# Patient Record
Sex: Female | Born: 1980
Health system: Southern US, Community
[De-identification: ages and names within clinical notes are randomized; demographics above are authoritative.]

## PROBLEM LIST (undated history)

## (undated) ENCOUNTER — Inpatient Hospital Stay (HOSPITAL_COMMUNITY): Payer: Self-pay

## (undated) DIAGNOSIS — B999 Unspecified infectious disease: Secondary | ICD-10-CM

## (undated) DIAGNOSIS — D649 Anemia, unspecified: Secondary | ICD-10-CM

## (undated) DIAGNOSIS — IMO0002 Reserved for concepts with insufficient information to code with codable children: Secondary | ICD-10-CM

## (undated) DIAGNOSIS — N83209 Unspecified ovarian cyst, unspecified side: Secondary | ICD-10-CM

## (undated) DIAGNOSIS — R87629 Unspecified abnormal cytological findings in specimens from vagina: Secondary | ICD-10-CM

## (undated) DIAGNOSIS — R87619 Unspecified abnormal cytological findings in specimens from cervix uteri: Secondary | ICD-10-CM

## (undated) HISTORY — DX: Unspecified abnormal cytological findings in specimens from cervix uteri: R87.619

## (undated) HISTORY — DX: Reserved for concepts with insufficient information to code with codable children: IMO0002

## (undated) HISTORY — PX: NO PAST SURGERIES: SHX2092

## (undated) HISTORY — DX: Unspecified abnormal cytological findings in specimens from vagina: R87.629

---

## 2013-02-14 ENCOUNTER — Encounter (HOSPITAL_COMMUNITY): Payer: Self-pay | Admitting: *Deleted

## 2013-02-14 ENCOUNTER — Inpatient Hospital Stay (HOSPITAL_COMMUNITY)
Admission: AD | Admit: 2013-02-14 | Discharge: 2013-02-14 | Disposition: A | Discharge status: Home / Self Care | Payer: Self-pay | Source: Ambulatory Visit | Attending: Obstetrics & Gynecology | Admitting: Obstetrics & Gynecology

## 2013-02-14 DIAGNOSIS — O2390 Unspecified genitourinary tract infection in pregnancy, unspecified trimester: Secondary | ICD-10-CM | POA: Insufficient documentation

## 2013-02-14 DIAGNOSIS — B3731 Acute candidiasis of vulva and vagina: Secondary | ICD-10-CM | POA: Insufficient documentation

## 2013-02-14 DIAGNOSIS — M25519 Pain in unspecified shoulder: Secondary | ICD-10-CM | POA: Insufficient documentation

## 2013-02-14 DIAGNOSIS — N949 Unspecified condition associated with female genital organs and menstrual cycle: Secondary | ICD-10-CM | POA: Insufficient documentation

## 2013-02-14 DIAGNOSIS — B373 Candidiasis of vulva and vagina: Secondary | ICD-10-CM

## 2013-02-14 DIAGNOSIS — O2341 Unspecified infection of urinary tract in pregnancy, first trimester: Secondary | ICD-10-CM

## 2013-02-14 HISTORY — DX: Unspecified ovarian cyst, unspecified side: N83.209

## 2013-02-14 LAB — URINALYSIS, ROUTINE W REFLEX MICROSCOPIC
Bilirubin Urine: NEGATIVE
Ketones, ur: NEGATIVE mg/dL
Nitrite: NEGATIVE
Urobilinogen, UA: 0.2 mg/dL (ref 0.0–1.0)

## 2013-02-14 LAB — WET PREP, GENITAL: Clue Cells Wet Prep HPF POC: NONE SEEN

## 2013-02-14 LAB — URINE MICROSCOPIC-ADD ON

## 2013-02-14 MED ORDER — CEPHALEXIN 500 MG PO CAPS: 500.0000 mg | ORAL_CAPSULE | Freq: Four times a day (QID) | ORAL | Status: DC

## 2013-02-14 MED ORDER — ACETAMINOPHEN 325 MG PO TABS
650.0000 mg | ORAL_TABLET | Freq: Once | ORAL | Status: AC
  Administered 2013-02-14: 650 mg via ORAL
  Filled 2013-02-14: qty 2

## 2013-02-14 NOTE — MAU Provider Note (Signed)
History     CSN: [id number]  Arrival date and time: 02/14/13 9147  Seen by provider at 0815    No chief complaint on file.  HPI Tina [name] 32 y.o. [redacted]w[redacted]d Comes to MAU with vaginal irritation for 3 days and thinks it is a yeast infection or BV.  Also has pain in right shoulder - tthinks she may have slept on it wrong and it is sore to the touch today.  Has just moved to [facility] and does not have insurance here yet.  Has not yet started prenatal care.  OB History   Grav Para Term Preterm Abortions TAB SAB Ect Mult Living   4 3 3       3       Past Medical History  Diagnosis Date  . Ovarian cyst     Past Surgical History  Procedure Laterality Date  . No past surgeries      No family history on file.  History  Substance Use Topics  . Smoking status: Never Smoker   . Smokeless tobacco: Not on file  . Alcohol Use: No    Allergies:  Allergies  Allergen Reactions  . Diflucan [Fluconazole] Itching and Rash    Prescriptions prior to admission  Medication Sig Dispense Refill  . Multiple Vitamins-Minerals (MULTI-VITAMIN GUMMIES PO) Take 2 each by mouth daily.        Review of Systems  Constitutional: Negative for fever.  Gastrointestinal: Negative for nausea, vomiting, abdominal pain, diarrhea and constipation.  Genitourinary:       Vaginal irritation Vaginal discharge. No vaginal bleeding. No dysuria.  Musculoskeletal:       Right shoulder pain   Physical Exam   Blood pressure 116/75, pulse 81, temperature 98.3 F (36.8 C), temperature source Oral, height 5' 1.5" (1.562 m), weight 136 lb 6 oz (61.859 kg), last menstrual period 12/02/2012.  Physical Exam  Nursing note and vitals reviewed. Constitutional: She is oriented to person, place, and time. She appears well-developed and well-nourished. No distress.  HENT:  Head: Normocephalic.  Eyes: EOM are normal.  Neck: Neck supple.  GI: Soft. There is no tenderness.  Musculoskeletal: Normal range of motion.   Has pain in anterior right shoulder joint when lifting her arm and pain to palpation on the anterior right shoulder around the bony prominences.  Neurological: She is alert and oriented to person, place, and time.  Skin: Skin is warm and dry.  Psychiatric: She has a normal mood and affect.    MAU Course  Procedures Results for orders placed during the hospital encounter of 02/14/13 (from the past 24 hour(s))  URINALYSIS, ROUTINE W REFLEX MICROSCOPIC     Status: Abnormal   Collection Time    02/14/13  7:15 AM      Result Value Range   Color, Urine YELLOW  YELLOW   APPearance CLOUDY (*) CLEAR   Specific Gravity, Urine >1.030 (*) 1.005 - 1.030   pH 6.0  5.0 - 8.0   Glucose, UA NEGATIVE  NEGATIVE mg/dL   Hgb urine dipstick TRACE (*) NEGATIVE   Bilirubin Urine NEGATIVE  NEGATIVE   Ketones, ur NEGATIVE  NEGATIVE mg/dL   Protein, ur NEGATIVE  NEGATIVE mg/dL   Urobilinogen, UA 0.2  0.0 - 1.0 mg/dL   Nitrite NEGATIVE  NEGATIVE   Leukocytes, UA MODERATE (*) NEGATIVE  URINE MICROSCOPIC-ADD ON     Status: Abnormal   Collection Time    02/14/13  7:15 AM      Result Value  Range   Squamous Epithelial / LPF FEW (*) RARE   WBC, UA 21-50  <3 WBC/hpf   RBC / HPF 0-2  <3 RBC/hpf   Bacteria, UA MANY (*) RARE  WET PREP, GENITAL     Status: Abnormal   Collection Time    02/14/13  8:48 AM      Result Value Range   Yeast Wet Prep HPF POC MODERATE (*) NONE SEEN   Trich, Wet Prep NONE SEEN  NONE SEEN   Clue Cells Wet Prep HPF POC NONE SEEN  NONE SEEN   WBC, Wet Prep HPF POC TOO NUMEROUS TO COUNT (*) NONE SEEN    MDM Tylenol 650 mg PO for shoulder pain.  Assessment and Plan  Yeast infection Possible UTI  Plan Keflex 500 mg PO BID x 7 days. Use Monistat or gynezol vaginal cream from over the counter for 7 days.   Drink at least 8 8-oz glasses of water every day. Take Tylenol 325 mg 2 tablets by mouth every 4 hours if needed for pain. Urine culture pending to confirm UTI No smoking, no  drugs, no alcohol.  Take a prenatal vitamin one by mouth every day.  Eat small frequent snacks to avoid nausea.  Begin prenatal care as soon as possible.   Pratik Dalziel 02/14/2013, 9:57 AM

## 2013-02-14 NOTE — MAU Note (Signed)
PT SAYS SHE STARTED HAVING VAG IRRITATION ON Saturday-  ITCHES , BURNING.Marland Kitchen   HAD PREG CONFIRMED IN [place]-  MOVED HERE LAST WEEK .   TOOK IUD OUT IN November THEN HAD NUVARING.     SAYS RIGHT SHOULDER HURTS - STARTED  ON Friday-    NO MED.

## 2013-02-14 NOTE — MAU Provider Note (Signed)
Attestation of Attending Supervision of Advanced Practitioner (CNM/NP): Evaluation and management procedures were performed by the Advanced Practitioner under my supervision and collaboration. I have reviewed the Advanced Practitioner's note and chart, and I agree with the management and plan.  Rodneisha Bonnet H. 4:22 PM   

## 2013-02-15 LAB — URINE CULTURE: Colony Count: >=100000

## 2013-02-15 LAB — GC/CHLAMYDIA PROBE AMP: GC Probe RNA: NEGATIVE

## 2013-03-24 ENCOUNTER — Encounter (HOSPITAL_COMMUNITY): Payer: Self-pay | Admitting: Gynecology

## 2013-03-31 ENCOUNTER — Other Ambulatory Visit: Payer: BC Managed Care – PPO

## 2013-03-31 ENCOUNTER — Ambulatory Visit (INDEPENDENT_AMBULATORY_CARE_PROVIDER_SITE_OTHER): Payer: BC Managed Care – PPO | Admitting: Obstetrics & Gynecology

## 2013-03-31 ENCOUNTER — Encounter: Payer: Self-pay | Admitting: Obstetrics & Gynecology

## 2013-03-31 VITALS — BP 111/77 | Temp 98.9°F | Wt 137.5 lb

## 2013-03-31 DIAGNOSIS — I10 Essential (primary) hypertension: Secondary | ICD-10-CM

## 2013-03-31 DIAGNOSIS — B373 Candidiasis of vulva and vagina: Secondary | ICD-10-CM

## 2013-03-31 DIAGNOSIS — O0930 Supervision of pregnancy with insufficient antenatal care, unspecified trimester: Secondary | ICD-10-CM

## 2013-03-31 DIAGNOSIS — L293 Anogenital pruritus, unspecified: Secondary | ICD-10-CM

## 2013-03-31 DIAGNOSIS — T7840XA Allergy, unspecified, initial encounter: Secondary | ICD-10-CM

## 2013-03-31 DIAGNOSIS — N898 Other specified noninflammatory disorders of vagina: Secondary | ICD-10-CM

## 2013-03-31 DIAGNOSIS — O0932 Supervision of pregnancy with insufficient antenatal care, second trimester: Secondary | ICD-10-CM

## 2013-03-31 LAB — POCT URINALYSIS DIP (DEVICE)
Bilirubin Urine: NEGATIVE
Hgb urine dipstick: NEGATIVE
Ketones, ur: NEGATIVE mg/dL
Protein, ur: NEGATIVE mg/dL
Specific Gravity, Urine: 1.025 (ref 1.005–1.030)
pH: 6 (ref 5.0–8.0)

## 2013-03-31 MED ORDER — DIPHENHYDRAMINE HCL 25 MG PO TABS: 25.0000 mg | ORAL_TABLET | Freq: Four times a day (QID) | ORAL | Status: DC | PRN

## 2013-03-31 MED ORDER — NYSTATIN 100000 UNITS VA TABS: 1.0000 | ORAL_TABLET | Freq: Every day | VAGINAL | Status: DC

## 2013-03-31 MED ORDER — FLUCONAZOLE 150 MG PO TABS: ORAL_TABLET | ORAL | Status: DC

## 2013-03-31 MED ORDER — NYSTATIN 100000 UNIT/GM EX CREA: TOPICAL_CREAM | Freq: Two times a day (BID) | CUTANEOUS | Status: DC

## 2013-03-31 NOTE — Progress Notes (Addendum)
Subjective:    Tina [name] is a [id number] at47w0d by certain LMP being seen today for her first obstetrical visit.  Her obstetrical history is unremarkable. Patient does intend to breast feed. Pregnancy history fully reviewed.  Patient reports vaginal discharge and itching x 5 days. Has used OTC yeast remedies but there is no alleviation of symptoms. She reports being allergic to Diflucan, and other imidazoles as they cause rash/hives or contact rash for the topical preparations.  She can tolerate them if she takes Benadryl before taking them.  No contractions, loss of fluid, vaginal bleeding.  Good fetal movement.   Filed Vitals:   03/31/13 1424  BP: 111/77  Temp: 98.9 F (37.2 C)  Weight: 137 lb 8 oz (62.37 kg)    HISTORY: OB History  Gravida Para Term Preterm AB SAB TAB Ectopic Multiple Living  4 3 3  0 0 0 0 0 0 3    # Outcome Date GA Lbr Len/2nd Weight Sex Delivery Anes PTL Lv  4 CUR           3 TRM 04/11/06 [redacted]w[redacted]d  7 lb 4 oz (3.289 kg) F SVD None  Y  2 TRM 04/19/03 [redacted]w[redacted]d  7 lb 4 oz (3.289 kg) M SVD EPI  Y  1 TRM 05/13/00 [redacted]w[redacted]d  7 lb 4 oz (3.289 kg) F SVD EPI  Y     Comments: 3rd laceration from delivery     Past Medical History  Diagnosis Date  . Ovarian cyst   . Abnormal Pap smear    Past Surgical History  Procedure Laterality Date  . No past surgeries     Family History  Problem Relation Age of Onset  . Asthma Mother   . Hyperlipidemia Father   . Hypertension Father   . Cancer Maternal Grandmother      Exam  Pelvic and breast exam performed by Tereso Newcomer, MD as patient requested a female provider to do this part of the exam.  Rest of exam performed by Dr. Everlene Other.   Uterus:   Gravid. Below umbilicus.  Pelvic Exam:    Perineum: Normal Perineum   Vulva: Normal   Vagina:  Copious amount of yellow, clumpy discharge, wet prep done; erythematous vaginal mucosa; tender on exam   Cervix: no lesions, pap smear done, some bleeding noted after pap   Adnexa: normal adnexa   Bony Pelvis: average  System: Breast:  normal appearance, no masses or tenderness   Skin: normal coloration and turgor, no rashes   Neurologic: normal   Extremities: No LE Edema   HEENT oropharynx clear, no lesions and neck supple with midline trachea   Mouth/Teeth mucous membranes moist, pharynx normal without lesions   Neck supple   Cardiovascular: regular rate and rhythm; 2/6 Systolic ejection murmur noted.    Respiratory:  chest clear, no wheezing, crepitations, rhonchi, normal symmetric air entry   Pelvic  erythermatous vaginal va   Abdomen: soft, non-tender; bowel sounds normal; no masses,  no organomegaly; Gravid uterus below the umbilicus.       Assessment:    Pregnancy: [id number] Patient Active Problem List   Diagnosis Date Noted  . Supervision of normal pregnancy 04/01/2013     Plan:     Initial labs drawn. Prenatal vitamins. Problem list reviewed and updated. Genetic Screening discussed Quad Screen: ordered. Ultrasound discussed; fetal survey: ordered. Follow up in 4 weeks. 50% of 30 min visit spent on counseling and coordination of care.   Doninique Lwin G  Dustina Scoggin, DO  Attestation of Attending Supervision of Resident: Evaluation and management procedures were performed by the [facility] Resident under my supervision.  I have seen and examined the patient, reviewed the resident's note and chart, and I agree with the management and plan. Patient was informed that it will not always be possible to have female providers as we do have four female physicians, female residents and female students always rotating with [place].    Jaynie Collins, MD, FACOG Attending Obstetrician & Gynecologist Faculty Practice, [facility] of [facility] 04/01/2013

## 2013-03-31 NOTE — Progress Notes (Signed)
Pulse- 84 Patient report itching white d/c

## 2013-04-01 ENCOUNTER — Telehealth: Payer: Self-pay | Admitting: *Deleted

## 2013-04-01 DIAGNOSIS — O0930 Supervision of pregnancy with insufficient antenatal care, unspecified trimester: Secondary | ICD-10-CM | POA: Insufficient documentation

## 2013-04-01 LAB — OBSTETRIC PANEL
Antibody Screen: NEGATIVE
Basophils Absolute: 0 10*3/uL (ref 0.0–0.1)
Basophils Relative: 0 % (ref 0–1)
Eosinophils Absolute: 0 10*3/uL (ref 0.0–0.7)
HCT: 29.8 % — ABNORMAL LOW (ref 36.0–46.0)
MCH: 22.6 pg — ABNORMAL LOW (ref 26.0–34.0)
MCHC: 32.2 g/dL (ref 30.0–36.0)
Monocytes Absolute: 0.6 10*3/uL (ref 0.1–1.0)
Neutro Abs: 4.7 10*3/uL (ref 1.7–7.7)
RDW: 16.9 % — ABNORMAL HIGH (ref 11.5–15.5)
Rh Type: POSITIVE

## 2013-04-01 LAB — WET PREP, GENITAL: Clue Cells Wet Prep HPF POC: NONE SEEN

## 2013-04-01 LAB — HIV ANTIBODY (ROUTINE TESTING W REFLEX): HIV: NONREACTIVE

## 2013-04-01 NOTE — Telephone Encounter (Signed)
Tina [name] called and reports she took the benadryl yesterday and then the diflucan as discussed, but doesn't think it helped and had to take more benadryl.  States when she woke up this am her lip is swollen and tongue. Wanted a call back, states should I take something else or wait it out?  Called Tina [name] and she states she took the benadryl first yesterday, then one hour later took the diflucan and within 30 minutes felt tingling in her hands.  States put nystatin cream on and within 30 seconds felt burning. ( States they are out of the nystatin tablets. ) States because she was still having allergy symptoms took more benadryl. States felt some edema in her throat during the night, and woke up with swollen lip and tongue.  States the throat is better- no edema now, and the tongue and lip swelling are less and going down.  Denies respiratory distress. During our discussion Tina [name] did not sound SOB or any respiratory distress.  I advised to go to ER is symptoms worsen or call EMS is severe .  I advised her to continue benadryl every 6 hours until all symptoms  resolved . I advised her to not take diflucan ever again as she could have an anaphylactic reaction as it sounds like this time she had a severe reaction. She states this was a worse reaction than before when she took benadryl and diflucan. I advised her not to use anymore nystatin cream or take the tablet until I send a message to [facility] and she advises [place] what else Tina [name] can do for the yeast infection and whether or not to try nystatin after all symptoms of allergic reaction resolved.  Tina [name] voices understanding and agrees with plan .

## 2013-04-01 NOTE — Telephone Encounter (Signed)
I do not know what else to prescribed for her as these are the mainstays of therapy for vulvovaginal candidiasis, wet prep was positive for moderate yeast which has been untreated for over a month now.   I will investigate other remedies and get back to her. Discontinue all prescribed medications for now.

## 2013-04-02 LAB — HEMOGLOBINOPATHY EVALUATION
Hgb A2 Quant: 2.2 % (ref 2.2–3.2)
Hgb A: 97.8 % (ref 96.8–97.8)
Hgb F Quant: 0 % (ref 0.0–2.0)

## 2013-04-02 LAB — CULTURE, OB URINE
Colony Count: NO GROWTH
Organism ID, Bacteria: NO GROWTH

## 2013-04-05 ENCOUNTER — Encounter: Payer: Self-pay | Admitting: Obstetrics & Gynecology

## 2013-04-05 LAB — AFP, QUAD SCREEN
INH: 198.9 pg/mL
Interpretation-AFP: NEGATIVE
MoM for AFP: 1.87
MoM for hCG: 0.59
Osb Risk: 1:2060 {titer}
Tri 18 Scr Risk Est: NEGATIVE
Trisomy 18 (Edward) Syndrome Interp.: 1:21700 {titer}
uE3 Mom: 1.06
uE3 Value: 0.7 ng/mL

## 2013-04-06 ENCOUNTER — Telehealth: Payer: Self-pay | Admitting: *Deleted

## 2013-04-06 ENCOUNTER — Ambulatory Visit (HOSPITAL_COMMUNITY): Payer: BC Managed Care – PPO

## 2013-04-06 NOTE — Telephone Encounter (Addendum)
Message copied by Jill Side on Tue Apr 06, 2013 11:23 AM ------      Message from: Jaynie Collins A      Created: Mon Apr 05, 2013  4:54 PM       Please offer patient boric acid capsules 600 mg pv qhs x 2 weeks for her yeast infection if she still has symptoms. ------ Called pt and left message that I am calling with a question and information from Dr. Macon Large. Please call back and leave a detailed message stating whether we may leave detailed information on your voice mail.

## 2013-04-06 NOTE — Telephone Encounter (Signed)
Patient called back and stated that it is okay to leave a detailed message.

## 2013-04-19 ENCOUNTER — Ambulatory Visit (HOSPITAL_COMMUNITY): Payer: Self-pay

## 2013-04-21 NOTE — Telephone Encounter (Signed)
Called Tina [name] and left a message that I was able to talk to someone from [facility] after several attempts and they informed me nystatin tablets on back order for [facility] so they transferred the presciption to [facility] in the hopes they could compound this for her.  Instructed [name] she should get a call from [facility] within a few days and if she does not to let us know and we can fill the presciption for the boric acid-

## 2013-04-21 NOTE — Telephone Encounter (Signed)
Called Alle and informed her of Dr. Mat Carne reccomendation if she is still having yeast symtoms. Remmie states after her allergic reaction to the diflucan that she waited a few days until the allergic symptoms resolved and then she used the nystatin cream for a few days and symptoms resolved. States she has been trying to get the nystatin suppositories but [facility] [place] have that. States her symptoms are returning and wants the nystatin. Discussed with her I can call in the boric acid capsules or she can try the nystatin, but that it is tablets you insert in the vagina not suppositories. Mersadie states she is just tired of dealing with this and wants the nurse to call [facility] and see if they will fill they nystatin tablets because she feels like the cream helped and [facility] is giving her the run around. I informed her I would call [facility] and if they cannot fill the nystatin tablets I would give them the prescription for the boric acid and I will call her back and let her know the outcome.

## 2013-04-23 ENCOUNTER — Other Ambulatory Visit: Payer: Self-pay | Admitting: Family Medicine

## 2013-04-23 ENCOUNTER — Ambulatory Visit (HOSPITAL_COMMUNITY): Admission: RE | Admit: 2013-04-23 | Payer: BC Managed Care – PPO | Source: Ambulatory Visit

## 2013-04-23 ENCOUNTER — Ambulatory Visit (HOSPITAL_COMMUNITY)
Admission: RE | Admit: 2013-04-23 | Discharge: 2013-04-23 | Disposition: A | Discharge status: Home / Self Care | Payer: BC Managed Care – PPO | Source: Ambulatory Visit | Attending: Obstetrics & Gynecology | Admitting: Obstetrics & Gynecology

## 2013-04-23 DIAGNOSIS — O358XX0 Maternal care for other (suspected) fetal abnormality and damage, not applicable or unspecified: Secondary | ICD-10-CM

## 2013-04-23 DIAGNOSIS — Z1389 Encounter for screening for other disorder: Secondary | ICD-10-CM

## 2013-04-23 DIAGNOSIS — O0930 Supervision of pregnancy with insufficient antenatal care, unspecified trimester: Secondary | ICD-10-CM | POA: Insufficient documentation

## 2013-04-23 DIAGNOSIS — Z3689 Encounter for other specified antenatal screening: Secondary | ICD-10-CM | POA: Insufficient documentation

## 2013-04-23 NOTE — Progress Notes (Signed)
Tina [name]  was seen today for an ultrasound appointment.  See full report in [facility].  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    Recommendations: Ultrasound findings discussed with Dr. Debroah Loop. Formal fetal echo scheduled within the next 2 weeks  Recommend follow up ultrasound in 4 weeks - plan genetic counseling at that visit pending the results of the fetal echo.  Alpha Gula, MD

## 2013-05-03 ENCOUNTER — Encounter: Payer: Self-pay | Admitting: Obstetrics & Gynecology

## 2013-05-03 ENCOUNTER — Ambulatory Visit (INDEPENDENT_AMBULATORY_CARE_PROVIDER_SITE_OTHER): Payer: BC Managed Care – PPO | Admitting: Obstetrics & Gynecology

## 2013-05-03 VITALS — BP 109/61 | Wt 145.3 lb

## 2013-05-03 DIAGNOSIS — O358XX0 Maternal care for other (suspected) fetal abnormality and damage, not applicable or unspecified: Secondary | ICD-10-CM

## 2013-05-03 DIAGNOSIS — N760 Acute vaginitis: Secondary | ICD-10-CM

## 2013-05-03 DIAGNOSIS — O358XX1 Maternal care for other (suspected) fetal abnormality and damage, fetus 1: Secondary | ICD-10-CM

## 2013-05-03 DIAGNOSIS — O0930 Supervision of pregnancy with insufficient antenatal care, unspecified trimester: Secondary | ICD-10-CM

## 2013-05-03 DIAGNOSIS — O0932 Supervision of pregnancy with insufficient antenatal care, second trimester: Secondary | ICD-10-CM

## 2013-05-03 LAB — POCT URINALYSIS DIP (DEVICE)
Bilirubin Urine: NEGATIVE
Glucose, UA: NEGATIVE mg/dL
Hgb urine dipstick: NEGATIVE
Nitrite: NEGATIVE
Specific Gravity, Urine: >=1.03 (ref 1.005–1.030)
Urobilinogen, UA: 0.2 mg/dL (ref 0.0–1.0)
pH: 5.5 (ref 5.0–8.0)

## 2013-05-03 MED ORDER — BORIC ACID CRYS: 600.0000 mg | CRYSTALS | Status: DC

## 2013-05-03 NOTE — Patient Instructions (Signed)
Return to clinic for any obstetric concerns or go to MAU for evaluation  

## 2013-05-03 NOTE — Progress Notes (Signed)
Discussed anatomy findings of AV septal defect follow up ECHO and ultrasound scheduled by MFM.  Yeast vaginitis is improved, but boric acid prescribed to maintain optimal pH balance.  No other complaints or concerns.  Routine obstetric precautions reviewed.

## 2013-05-03 NOTE — Progress Notes (Signed)
Pulse: 88

## 2013-05-07 ENCOUNTER — Ambulatory Visit (HOSPITAL_COMMUNITY)
Admission: RE | Admit: 2013-05-07 | Discharge: 2013-05-07 | Disposition: A | Discharge status: Home / Self Care | Payer: BC Managed Care – PPO | Source: Ambulatory Visit

## 2013-05-07 ENCOUNTER — Other Ambulatory Visit: Payer: Self-pay

## 2013-05-07 ENCOUNTER — Inpatient Hospital Stay (HOSPITAL_COMMUNITY)
Admission: AD | Admit: 2013-05-07 | Discharge: 2013-05-07 | Disposition: A | Discharge status: Home / Self Care | Payer: BC Managed Care – PPO | Source: Ambulatory Visit | Attending: Family Medicine | Admitting: Family Medicine

## 2013-05-07 ENCOUNTER — Inpatient Hospital Stay (HOSPITAL_COMMUNITY): Payer: BC Managed Care – PPO

## 2013-05-07 ENCOUNTER — Other Ambulatory Visit (HOSPITAL_COMMUNITY): Payer: Self-pay | Admitting: Maternal and Fetal Medicine

## 2013-05-07 ENCOUNTER — Encounter (HOSPITAL_COMMUNITY): Payer: Self-pay | Admitting: *Deleted

## 2013-05-07 ENCOUNTER — Ambulatory Visit (HOSPITAL_COMMUNITY)
Admission: RE | Admit: 2013-05-07 | Discharge: 2013-05-07 | Disposition: A | Discharge status: Home / Self Care | Payer: BC Managed Care – PPO | Source: Ambulatory Visit | Attending: Family Medicine | Admitting: Family Medicine

## 2013-05-07 DIAGNOSIS — IMO0002 Reserved for concepts with insufficient information to code with codable children: Secondary | ICD-10-CM

## 2013-05-07 DIAGNOSIS — O368390 Maternal care for abnormalities of the fetal heart rate or rhythm, unspecified trimester, not applicable or unspecified: Secondary | ICD-10-CM | POA: Diagnosis not present

## 2013-05-07 DIAGNOSIS — O4690 Antepartum hemorrhage, unspecified, unspecified trimester: Secondary | ICD-10-CM | POA: Insufficient documentation

## 2013-05-07 DIAGNOSIS — R51 Headache: Secondary | ICD-10-CM | POA: Diagnosis not present

## 2013-05-07 DIAGNOSIS — O358XX0 Maternal care for other (suspected) fetal abnormality and damage, not applicable or unspecified: Secondary | ICD-10-CM

## 2013-05-07 DIAGNOSIS — O358XX1 Maternal care for other (suspected) fetal abnormality and damage, fetus 1: Secondary | ICD-10-CM

## 2013-05-07 DIAGNOSIS — K644 Residual hemorrhoidal skin tags: Secondary | ICD-10-CM | POA: Diagnosis not present

## 2013-05-07 DIAGNOSIS — O228X9 Other venous complications in pregnancy, unspecified trimester: Secondary | ICD-10-CM | POA: Insufficient documentation

## 2013-05-07 DIAGNOSIS — O4692 Antepartum hemorrhage, unspecified, second trimester: Secondary | ICD-10-CM

## 2013-05-07 HISTORY — DX: Unspecified infectious disease: B99.9

## 2013-05-07 HISTORY — DX: Anemia, unspecified: D64.9

## 2013-05-07 LAB — URINALYSIS, ROUTINE W REFLEX MICROSCOPIC
Bilirubin Urine: NEGATIVE
Glucose, UA: NEGATIVE mg/dL
Hgb urine dipstick: NEGATIVE
Specific Gravity, Urine: 1.01 (ref 1.005–1.030)

## 2013-05-07 LAB — URINE MICROSCOPIC-ADD ON

## 2013-05-07 NOTE — MAU Note (Signed)
Noted bleeding when wiped, little pressure, no pain. No hx of previa or low lying placenta

## 2013-05-07 NOTE — MAU Note (Signed)
Patient states she has had two episodes of bright red bleeding on tissue with wiping, no active bleeding. Has some mild pressure off and on and has felt fetal movement this am.

## 2013-05-07 NOTE — MAU Provider Note (Signed)
Chief Complaint:  Vaginal Bleeding   Stephannie CLARA HERBISON is a 32 y.o.  [phone] with IUP at [redacted]w[redacted]d by LMP presenting for Vaginal Bleeding  Pt reports 3 episodes of blood when wiping after using the bathroom. Twice this morning after urinating and once on arrival to the MAU after a BM. She states the blood was bright red and a small amount. She denies any blood in her underwear. She has not had to wear a pad. She endorses a headache since last night but denies vision changes, RUQ pain, lower extremity swelling, fever, chills, nausea, vomiting, dysuria, vaginal discharge, vaginal itching, flank pain. She denies LOF and contractions. She reports normal fetal movement. She has a history of hemorrhoids but denies any pain or problems recently. She last had intercourse 2 days ago and denies any postcoital bleeding.   Menstrual History: OB History   Grav Para Term Preterm Abortions TAB SAB Ect Mult Living   4 3 3  0 0 0 0 0 0 3      Patient's last menstrual period was 12/02/2012.      Past Medical History  Diagnosis Date  . Ovarian cyst   . Anemia   . Infection     UTI  . Abnormal Pap smear     f/u was ok    Past Surgical History  Procedure Laterality Date  . No past surgeries      Family History  Problem Relation Age of Onset  . Asthma Mother   . Hyperlipidemia Father   . Hypertension Father   . Cancer Maternal Grandmother   . Cancer Paternal Grandfather     prostate    History  Substance Use Topics  . Smoking status: Never Smoker   . Smokeless tobacco: Never Used  . Alcohol Use: No      Allergies  Allergen Reactions  . Diflucan [Fluconazole] Anaphylaxis and Rash  . Ferrous Sulfate Anaphylaxis    Iv dose  . Monistat [Miconazole]     burning  . Tdap [Diphth-Acell Pertussis-Tetanus] Swelling    fever    Prescriptions prior to admission  Medication Sig Dispense Refill  . diphenhydrAMINE (BENADRYL) 25 MG tablet Take 1-2 tablets (25-50 mg total) by mouth every 6  (six) hours as needed for itching or allergies (prior to taking Diflucan).  30 tablet  3  . Prenatal Vit-Min-FA-Fish Oil ([employer] PRENATAL GUMMY PO) Take 2 each by mouth daily.      Marland Kitchen PRESCRIPTION MEDICATION Place 1 suppository vaginally at bedtime. Nystatin suppository      . Boric Acid CRYS Place 600 mg vaginally 2 (two) times a week.  500 g  5    Review of Systems - Negative except for what is mentioned in HPI.  Physical Exam  Blood pressure 126/68, pulse 92, temperature 98.4 F (36.9 C), temperature source Oral, resp. rate 16, height 5' 1.5" (1.562 m), weight 66.225 kg (146 lb), last menstrual period 12/02/2012, SpO2 100.00%. GENERAL: Well-developed, well-nourished female in no acute distress.  LUNGS: Clear to auscultation bilaterally.  HEART: Regular rate and rhythm. ABDOMEN: Soft, nontender, nondistended, gravid, no CVA tenderness  EXTREMITIES: Nontender, no edema, 2+ distal pulses. GU: SSE performed, no blood in the vaginal vault, no discharge, cervix normal appearing with no evidence of trauma or bleeding, cervix is parous and closed.  External hemorrhoids present, no sign of irritation, thrombosis or bleeding.  Cervical Exam: Dilatation: closed  Effacement: thick   FHT:  136 BPM by doppler Contractions: pt denies   Labs:  Results for orders placed during the hospital encounter of 05/07/13 (from the past 24 hour(s))  URINALYSIS, ROUTINE W REFLEX MICROSCOPIC   Collection Time    05/07/13  9:46 AM      Result Value Range   Color, Urine YELLOW  YELLOW   APPearance CLEAR  CLEAR   Specific Gravity, Urine 1.010  1.005 - 1.030   pH 7.0  5.0 - 8.0   Glucose, UA NEGATIVE  NEGATIVE mg/dL   Hgb urine dipstick NEGATIVE  NEGATIVE   Bilirubin Urine NEGATIVE  NEGATIVE   Ketones, ur NEGATIVE  NEGATIVE mg/dL   Protein, ur NEGATIVE  NEGATIVE mg/dL   Urobilinogen, UA 0.2  0.0 - 1.0 mg/dL   Nitrite NEGATIVE  NEGATIVE   Leukocytes, UA TRACE (*) NEGATIVE  URINE MICROSCOPIC-ADD ON   Collection  Time    05/07/13  9:46 AM      Result Value Range   Squamous Epithelial / LPF FEW (*) RARE   WBC, UA 3-6  <3 WBC/hpf   Bacteria, UA FEW (*) RARE    Imaging Studies:   US OB Limited 05/07/2013: Cervical length 3.55. Anterior placenta. Cephalic presentation.  FHR 61 with AV canal defect and small pericardial effusion.   Assessment: KRISNA OMAR is  32 y.o. G4P3003 at [redacted]w[redacted]d presents with Vaginal Bleeding  - No blood in the vaginal vault on SSE. Cervix closed and thick. Cervical length 3.55 by ultrasound. Etiology of the spotting unclear but felt most likely to be related to hemorrhoids.  - FHR initially 136 by Doppler however repeat Doppler performed following ultrasound with FHR 68 BPM, consistent with ultrasound findings.  - Ultrasound today with significant fetal findings including bradycardia (FHR 61), AV septal defect and pericardial effusion.  - Pt counseled by MFM that the above ultrasound findings suggest an increased risk of Trisomy 21. She elected to proceed with an amniocentesis today and will follow up with MFM about the results on Monday 11/10.  - She also had an appointment with pediatric cardiology this afternoon and that provider confirmed the AV septal defect. He also stated that he suspects the slow fetal heart rate is related to PACs with 1:3 conduction.  - Pt counseled on possible risks including fetal demise and congenital heart defect requiring major surgery after birth or resulting in death after birth. She also understands that at this point the fetus is pre-viable therefore delivery by emergent means is not possible.   Plan: 1. Pt to follow up with MFM on Monday 11/10 for amniocentesis results.  2. Further decisions and recommendations to be made following that appointment.  3. Return precautions given.   Pt discussed and seen with attending Dr. Kevin Fenton, Meisha Salone 11/7/201411:18 AM

## 2013-05-07 NOTE — Consult Note (Signed)
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    Findings and limitations of the study were discussed with the patient and her husband.  I explained that the constellation of findings increased risk for intrinsic fetal disease (eg, aneuploidy).  Hence, I offered genetic counseling and discussed purpose, technical aspects, and risks germaine to amniocentesis versus cell free fetal DNA screening.  She opted for amniocentesis with FISH+karyotype to expedite results.  I discussed the risks and benefits of the procedure and the patient gave informed consent.  The patient was prepped and draped in the standard fashion.  The mandatory time-out was then taken and patient identifier information was confirmed.  Amniocentesis was performed using continuous real-time ultrasound guidance, sterile technique, and a 22-gauge needle.  A single pass of the needle in the amniotic sac yielded 35 ml of amniotic fluid.  There were no apparent complications and a normal amniotic fluid volume and fetal heart rate were observed after the procedure.  Discharge instructions were given to the patient both verbally and in writing.    The patient is Rh positive.    Recommendations: 1. Follow up viability and hydrops scan by ultrasound with our unit in 3 days along with consultations with NICU and genetic counseling. 2. Patient has an appointment with pediatric cardiology today immediately following my evaluation.    Time Spent: I spent in excess of 30 minutes in consultation with this patient to review records, evaluate her case, and provide her with an adequate discussion and education.  More than 50% of this  time was spent in direct face-to-face counseling. It was a pleasure seeing your patient in the office today.  Thank you for consultation. Please do not hesitate to contact our service for any further questions.   Thank you,  Louann Sjogren Gaynelle Arabian, Louann Sjogren, MD, MS, FACOG Assistant Professor Section of Maternal-Fetal Medicine [facility]

## 2013-05-07 NOTE — Consult Note (Signed)
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    Findings and limitations of the study were discussed with the patient and her husband.  I explained that the constellation of findings increased risk for intrinsic fetal disease (eg, aneuploidy).  Hence, I offered genetic counseling and discussed purpose, technical aspects, and risks germane to amniocentesis versus cell free fetal DNA screening.  She opted for amniocentesis with FISH+karyotype to expedite results.  I discussed the risks and benefits of the procedure and the patient gave informed consent.  The patient was prepped and draped in the standard fashion.  The mandatory time-out was then taken and patient identifier information was confirmed.  Amniocentesis was performed using continuous real-time ultrasound guidance, sterile technique, and a 22-gauge needle.  A single pass of the needle in the amniotic sac yielded 35 ml of amniotic fluid.  There were no apparent complications and a normal amniotic fluid volume but persistent bradycardia were observed after the procedure.  Discharge instructions were given to the patient both verbally and in writing.    The patient is Rh positive.    Recommendations: 1. Follow up viability and hydrops scan by ultrasound with our unit in 3 days along with consultations with NICU and genetic counseling. 2. Patient has an appointment with pediatric cardiology today immediately following my evaluation.    Time Spent: I spent in excess of 30 minutes in consultation with this patient to review records, evaluate her case, and provide her with an adequate discussion and education.  More than 50% of  this time was spent in direct face-to-face counseling. It was a pleasure seeing your patient in the office today.  Thank you for consultation. Please do not hesitate to contact our service for any further questions.   Thank you,  Louann Sjogren Gaynelle Arabian, Louann Sjogren, MD, MS, FACOG Assistant Professor Section of Maternal-Fetal Medicine [facility]

## 2013-05-08 LAB — URINE CULTURE
Colony Count: NO GROWTH
Culture: NO GROWTH

## 2013-05-08 NOTE — MAU Provider Note (Signed)
Pt. Seen and examined.  Accompanied MFM through discussion.  Called and discussed with Peds Cards 3 x today.  After lengthy discussion with pt, she was considering termination.  Advised that termination after 20 wks, would have [place] another state.  She will await amnio results on Monday, appointment with MFM at 11:30.  Peds Cards states fetal bradycardia ok and may resolve.  They also state right ventricle is likely ok size.  Discussed risks of prematurity with pt.  Right now, child is considered pre-viable.  Pt. Understands this and that meaningful survival may not occur until at least 28 wks, given heart defect.

## 2013-05-10 ENCOUNTER — Ambulatory Visit (HOSPITAL_COMMUNITY)
Admission: RE | Admit: 2013-05-10 | Discharge: 2013-05-10 | Disposition: A | Discharge status: Home / Self Care | Payer: BC Managed Care – PPO | Source: Ambulatory Visit | Attending: Family Medicine | Admitting: Family Medicine

## 2013-05-10 ENCOUNTER — Other Ambulatory Visit: Payer: Self-pay | Admitting: Family Medicine

## 2013-05-10 ENCOUNTER — Encounter: Payer: Self-pay | Admitting: Family Medicine

## 2013-05-10 VITALS — BP 121/74 | Wt 145.0 lb

## 2013-05-10 DIAGNOSIS — O4690 Antepartum hemorrhage, unspecified, unspecified trimester: Secondary | ICD-10-CM

## 2013-05-10 DIAGNOSIS — O368390 Maternal care for abnormalities of the fetal heart rate or rhythm, unspecified trimester, not applicable or unspecified: Secondary | ICD-10-CM | POA: Insufficient documentation

## 2013-05-10 DIAGNOSIS — O358XX1 Maternal care for other (suspected) fetal abnormality and damage, fetus 1: Secondary | ICD-10-CM

## 2013-05-10 DIAGNOSIS — O358XX0 Maternal care for other (suspected) fetal abnormality and damage, not applicable or unspecified: Secondary | ICD-10-CM

## 2013-05-10 LAB — GC/CHLAMYDIA PROBE AMP
CT Probe RNA: NEGATIVE
GC Probe RNA: NEGATIVE

## 2013-05-10 NOTE — Progress Notes (Signed)
Tina [name]  was seen today for an ultrasound appointment.  See full report in [facility].  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  FISH and karyotype pending Small rim of pericardial effusion - stable over last several days; no evidence of fetal hydrops  Recommendations: Recommend weekly hydrops checks.  Patient to meet with NICU today. Per Peds Cardiology - at some increased risk for fetal hydrops due to fetal bradycardia.  There was some discussion of po Terbutaline to increase fetal heart rate. Will discuss further with Peds cardiology once results of amniocentesis return (at next visit)  Alpha Gula, MD

## 2013-05-11 ENCOUNTER — Telehealth (HOSPITAL_COMMUNITY): Payer: Self-pay | Admitting: MS"

## 2013-05-11 NOTE — Addendum Note (Signed)
Encounter addended by: Alessandra Bevels. Chase Picket, RN on: 05/11/2013 11:39 AM<BR>     Documentation filed: Charges VN

## 2013-05-11 NOTE — Telephone Encounter (Signed)
Called Tina [name] to discuss the preliminary FISH results from her amniocentesis We reviewed that these are within normal limits.  We again discussed the limitations of FISH and that final results are still pending and will be available in 1-2 weeks.  Patient reported that she has not noticed any concerning symptoms or complications following the procedure.  All questions were answered to her satisfaction, she was encouraged to call with additional questions or concerns.  Quinn Plowman, MS Certified Genetic Counselor 05/11/2013 12:08 PM

## 2013-05-18 ENCOUNTER — Telehealth (HOSPITAL_COMMUNITY): Payer: Self-pay | Admitting: MS"

## 2013-05-18 ENCOUNTER — Ambulatory Visit (HOSPITAL_COMMUNITY)
Admission: RE | Admit: 2013-05-18 | Discharge: 2013-05-18 | Disposition: A | Discharge status: Home / Self Care | Payer: BC Managed Care – PPO | Source: Ambulatory Visit | Attending: Family Medicine | Admitting: Family Medicine

## 2013-05-18 ENCOUNTER — Encounter: Payer: Self-pay | Admitting: Obstetrics & Gynecology

## 2013-05-18 DIAGNOSIS — O368390 Maternal care for abnormalities of the fetal heart rate or rhythm, unspecified trimester, not applicable or unspecified: Secondary | ICD-10-CM | POA: Insufficient documentation

## 2013-05-18 DIAGNOSIS — O358XX0 Maternal care for other (suspected) fetal abnormality and damage, not applicable or unspecified: Secondary | ICD-10-CM | POA: Insufficient documentation

## 2013-05-18 DIAGNOSIS — Z363 Encounter for antenatal screening for malformations: Secondary | ICD-10-CM | POA: Insufficient documentation

## 2013-05-18 DIAGNOSIS — O358XX1 Maternal care for other (suspected) fetal abnormality and damage, fetus 1: Secondary | ICD-10-CM

## 2013-05-18 DIAGNOSIS — Z1389 Encounter for screening for other disorder: Secondary | ICD-10-CM | POA: Insufficient documentation

## 2013-05-18 NOTE — Telephone Encounter (Signed)
Called Tina [name] to discuss the final karyotype results from her amniocentesis We reviewed that these are within normal limits.  We reviewed that this does not rule out all genetic conditions or smaller chromosome rearrangements. Given the presence of fetal heart defect, we discussed the option of additionally testing for 22q11 deletion syndrome. Briefly discussed with the patient that this condition is associated with congenital heart defects, though typically other types of heart defects are more described than AV canal with this condition. She agreed to add 22q11 deletion testing to the amniocentesis sample.  All questions were answered to her satisfaction, she was encouraged to call with additional questions or concerns.  Quinn Plowman, MS Certified Genetic Counselor 05/18/2013 2:27 PM

## 2013-05-19 ENCOUNTER — Other Ambulatory Visit: Payer: Self-pay | Admitting: Obstetrics & Gynecology

## 2013-05-19 ENCOUNTER — Telehealth: Payer: Self-pay

## 2013-05-19 ENCOUNTER — Encounter: Payer: Self-pay | Admitting: *Deleted

## 2013-05-19 NOTE — Telephone Encounter (Signed)
Tina [name] called again and states she is returning our call. States she doesn;t know why she is calling, has sent a my chart message.  States The nystatin is fine- she is not allergic to it.   States can we please order it from [facility] which is in her file.

## 2013-05-19 NOTE — Telephone Encounter (Signed)
Per Tina [name], pt can have a refill on her nystatin vaginal suppository for yeast.

## 2013-05-19 NOTE — Telephone Encounter (Signed)
Message     Hello     I would like to request a refill of nystatin suppository. Have a yeast now. Thank you. Please send to [facility], the last place that filled it. And If possible could you see if they can make me prenatal pill In either gel cap or capsules please. Thank you.     [phone] my cell                   Select Font Size     Small Medium Large Extra Extra Large             [name] Description: 32 year old female  05/18/2013 Patient Email Provider: Tereso Newcomer, MD  MRN: [id number] Department: [facility]                Call Documentation    No notes of this type exist for this encounter.         Encounter MyChart Messages    Read Composed From To Subject   Y 05/18/2013 5:55 PM Tina Manfred Arch, MD Non-Urgent Medical Question         Created by    Generic Mychart on 05/18/2013 05:55 PM

## 2013-05-20 ENCOUNTER — Other Ambulatory Visit: Payer: Self-pay

## 2013-05-20 ENCOUNTER — Encounter: Payer: Self-pay | Admitting: *Deleted

## 2013-05-20 ENCOUNTER — Telehealth (HOSPITAL_COMMUNITY): Payer: Self-pay | Admitting: MS"

## 2013-05-20 MED ORDER — NYSTATIN 100000 UNITS VA TABS: 1.0000 | ORAL_TABLET | Freq: Every day | VAGINAL | Status: DC

## 2013-05-20 MED ORDER — CVS PRENATAL GUMMY 0.4-113.5 MG PO CHEW: 1.0000 | CHEWABLE_TABLET | Freq: Every day | ORAL | Status: DC

## 2013-05-20 NOTE — Telephone Encounter (Addendum)
Left message for patient to return call.   Tina [name] 05/20/2013 11:26 AM   Discussed with Tina [name] that 22q11 deletion FISH on amniocytes was normal. Reviewed that this testing does not rule out all genetic conditions. Also discussed that postnatal evaluation would be available for the baby and additional genetic testing could be performed at that time, if warranted.   Tina Braun Kary Colaizzi 05/20/2013 11:37 AM

## 2013-05-20 NOTE — Telephone Encounter (Signed)
Called pt and left message that her

## 2013-05-21 ENCOUNTER — Inpatient Hospital Stay (HOSPITAL_COMMUNITY)
Admission: RE | Admit: 2013-05-21 | Discharge: 2013-05-21 | Disposition: A | Discharge status: Home / Self Care | Payer: BC Managed Care – PPO | Source: Ambulatory Visit

## 2013-05-21 ENCOUNTER — Encounter (HOSPITAL_COMMUNITY): Payer: BC Managed Care – PPO

## 2013-05-24 ENCOUNTER — Ambulatory Visit (HOSPITAL_COMMUNITY)
Admission: RE | Admit: 2013-05-24 | Discharge: 2013-05-24 | Disposition: A | Discharge status: Home / Self Care | Payer: BC Managed Care – PPO | Source: Ambulatory Visit | Attending: Obstetrics and Gynecology | Admitting: Obstetrics and Gynecology

## 2013-05-24 VITALS — BP 128/68 | HR 92 | Wt 145.0 lb

## 2013-05-24 DIAGNOSIS — O0930 Supervision of pregnancy with insufficient antenatal care, unspecified trimester: Secondary | ICD-10-CM

## 2013-05-24 DIAGNOSIS — O358XX1 Maternal care for other (suspected) fetal abnormality and damage, fetus 1: Secondary | ICD-10-CM

## 2013-05-24 DIAGNOSIS — O358XX0 Maternal care for other (suspected) fetal abnormality and damage, not applicable or unspecified: Secondary | ICD-10-CM | POA: Insufficient documentation

## 2013-05-24 NOTE — Progress Notes (Signed)
Tina [name]  was seen today for an ultrasound appointment.  See full report in [facility].  Impression: Single IUP at 24 5/7 weeks Fetal AV canal; on previous ultrsaounds, noted to have frequent PACs/ atrial bigeminy with block beats and heart rate in the 60-70 range Normal karyotype by amnio. On today's study, the fetal heart rate was normal (135 bpm) without signs of hydrops Fetal growth is appropriate (45th %tile) Normal amniotic fluid volume  Recommendations: Will continue weekly ultrasounds /hydrops checks over next 2-3 weeks.  If stable, will decrease surveillance. Follow up growth scan in 3-4 weeks. Follow up with Peds Cardiology - recommendations for delivery  Alpha Gula, MD

## 2013-05-25 NOTE — Addendum Note (Signed)
Encounter addended by: Alessandra Bevels. Chase Picket, RN on: 05/25/2013  2:02 PM<BR>     Documentation filed: Charges VN, OB Prenatal Vitals

## 2013-05-26 ENCOUNTER — Encounter: Payer: Self-pay | Admitting: *Deleted

## 2013-05-31 ENCOUNTER — Other Ambulatory Visit (HOSPITAL_COMMUNITY): Payer: Self-pay | Admitting: Maternal and Fetal Medicine

## 2013-05-31 ENCOUNTER — Encounter: Payer: BC Managed Care – PPO | Admitting: Obstetrics & Gynecology

## 2013-05-31 DIAGNOSIS — O358XX1 Maternal care for other (suspected) fetal abnormality and damage, fetus 1: Secondary | ICD-10-CM

## 2013-06-01 ENCOUNTER — Encounter: Payer: Self-pay | Admitting: Obstetrics and Gynecology

## 2013-06-01 ENCOUNTER — Ambulatory Visit (INDEPENDENT_AMBULATORY_CARE_PROVIDER_SITE_OTHER): Payer: BC Managed Care – PPO | Admitting: Obstetrics and Gynecology

## 2013-06-01 ENCOUNTER — Ambulatory Visit (HOSPITAL_COMMUNITY)
Admission: RE | Admit: 2013-06-01 | Discharge: 2013-06-01 | Disposition: A | Discharge status: Home / Self Care | Payer: BC Managed Care – PPO | Source: Ambulatory Visit | Attending: Obstetrics and Gynecology | Admitting: Obstetrics and Gynecology

## 2013-06-01 ENCOUNTER — Ambulatory Visit (HOSPITAL_COMMUNITY): Payer: BC Managed Care – PPO

## 2013-06-01 VITALS — BP 133/70 | HR 90 | Wt 148.0 lb

## 2013-06-01 VITALS — BP 110/69 | Wt 145.8 lb

## 2013-06-01 DIAGNOSIS — O358XX0 Maternal care for other (suspected) fetal abnormality and damage, not applicable or unspecified: Secondary | ICD-10-CM | POA: Insufficient documentation

## 2013-06-01 DIAGNOSIS — O0932 Supervision of pregnancy with insufficient antenatal care, second trimester: Secondary | ICD-10-CM

## 2013-06-01 DIAGNOSIS — O358XX1 Maternal care for other (suspected) fetal abnormality and damage, fetus 1: Secondary | ICD-10-CM

## 2013-06-01 DIAGNOSIS — O0930 Supervision of pregnancy with insufficient antenatal care, unspecified trimester: Secondary | ICD-10-CM

## 2013-06-01 LAB — POCT URINALYSIS DIP (DEVICE)
Hgb urine dipstick: NEGATIVE
Ketones, ur: NEGATIVE mg/dL
Leukocytes, UA: NEGATIVE
Nitrite: NEGATIVE
Protein, ur: NEGATIVE mg/dL
Specific Gravity, Urine: 1.02 (ref 1.005–1.030)
Urobilinogen, UA: 0.2 mg/dL (ref 0.0–1.0)
pH: 7 (ref 5.0–8.0)

## 2013-06-01 NOTE — Progress Notes (Signed)
Pulse: 88

## 2013-06-01 NOTE — Progress Notes (Signed)
Patient doing well without complaints. FM/PTL precautions reviewed. Patient has f/u ultrasound today with MFM and fetal echo on 12/10. 1hr GCT and labs next visit

## 2013-06-09 ENCOUNTER — Ambulatory Visit (HOSPITAL_COMMUNITY): Payer: BC Managed Care – PPO

## 2013-06-21 ENCOUNTER — Encounter: Payer: Self-pay | Admitting: Obstetrics and Gynecology

## 2013-06-21 ENCOUNTER — Ambulatory Visit (HOSPITAL_COMMUNITY)
Admission: RE | Admit: 2013-06-21 | Discharge: 2013-06-21 | Disposition: A | Discharge status: Home / Self Care | Payer: BC Managed Care – PPO | Source: Ambulatory Visit | Attending: Obstetrics and Gynecology | Admitting: Obstetrics and Gynecology

## 2013-06-21 DIAGNOSIS — O358XX1 Maternal care for other (suspected) fetal abnormality and damage, fetus 1: Secondary | ICD-10-CM

## 2013-06-21 DIAGNOSIS — O358XX0 Maternal care for other (suspected) fetal abnormality and damage, not applicable or unspecified: Secondary | ICD-10-CM | POA: Insufficient documentation

## 2013-06-23 ENCOUNTER — Telehealth: Payer: Self-pay | Admitting: *Deleted

## 2013-06-23 NOTE — Telephone Encounter (Signed)
Called pt. Who states she has a cough and is congested and wants to know what medication she can take. Informed pt. She should not take any medication with Phenylephrine. Stated she can take Mucinex, tylenol cold/allergy, Delsym. Re-itterated whatever she gets should not have that ingredient of Phenylephrine. Pt. Verbalized understanding and gratitude and had no further questions or concerns.

## 2013-06-23 NOTE — Telephone Encounter (Signed)
Pt called nurse line requesting information on what she can take for a chest cold.

## 2013-06-24 ENCOUNTER — Inpatient Hospital Stay (HOSPITAL_COMMUNITY)
Admission: AD | Admit: 2013-06-24 | Discharge: 2013-06-24 | Disposition: A | Discharge status: Home / Self Care | Payer: BC Managed Care – PPO | Source: Ambulatory Visit | Attending: Obstetrics & Gynecology | Admitting: Obstetrics & Gynecology

## 2013-06-24 ENCOUNTER — Encounter (HOSPITAL_COMMUNITY): Payer: Self-pay | Admitting: *Deleted

## 2013-06-24 DIAGNOSIS — R509 Fever, unspecified: Secondary | ICD-10-CM | POA: Insufficient documentation

## 2013-06-24 DIAGNOSIS — O99891 Other specified diseases and conditions complicating pregnancy: Secondary | ICD-10-CM | POA: Insufficient documentation

## 2013-06-24 DIAGNOSIS — R059 Cough, unspecified: Secondary | ICD-10-CM | POA: Insufficient documentation

## 2013-06-24 DIAGNOSIS — B349 Viral infection, unspecified: Secondary | ICD-10-CM

## 2013-06-24 DIAGNOSIS — R05 Cough: Secondary | ICD-10-CM | POA: Insufficient documentation

## 2013-06-24 DIAGNOSIS — B9789 Other viral agents as the cause of diseases classified elsewhere: Secondary | ICD-10-CM | POA: Insufficient documentation

## 2013-06-24 DIAGNOSIS — J3489 Other specified disorders of nose and nasal sinuses: Secondary | ICD-10-CM | POA: Insufficient documentation

## 2013-06-24 LAB — URINALYSIS, ROUTINE W REFLEX MICROSCOPIC
Bilirubin Urine: NEGATIVE
Glucose, UA: NEGATIVE mg/dL
Hgb urine dipstick: NEGATIVE
Leukocytes, UA: NEGATIVE
Nitrite: NEGATIVE
Specific Gravity, Urine: 1.02 (ref 1.005–1.030)
pH: 6 (ref 5.0–8.0)

## 2013-06-24 MED ORDER — OSELTAMIVIR PHOSPHATE 75 MG PO CAPS: 75.0000 mg | ORAL_CAPSULE | Freq: Two times a day (BID) | ORAL | Status: DC

## 2013-06-24 NOTE — MAU Provider Note (Signed)
History     CSN: [id number]  Arrival date and time: 06/24/13 1656   First Provider Initiated Contact with Patient 06/24/13 1722      Chief Complaint  Patient presents with  . Fever  . Chills  . Cough   HPI  Pt is a 32 yo G4P3003 at [redacted]w[redacted]d weeks IUP here with report of fever, chills, and cough that started yesterday.  Cough is described as productive (mucus).  Also reports nasal congestion.  +body aches.  Highest temp today was 101.5.  Pt took Tylenol with decrease in temp, but it rises again.  Daughter was recently sick with similar symptoms.    Past Medical History  Diagnosis Date  . Ovarian cyst   . Anemia   . Infection     UTI  . Abnormal Pap smear     f/u was ok    Past Surgical History  Procedure Laterality Date  . No past surgeries      Family History  Problem Relation Age of Onset  . Asthma Mother   . Hyperlipidemia Father   . Hypertension Father   . Cancer Maternal Grandmother   . Cancer Paternal Grandfather     prostate    History  Substance Use Topics  . Smoking status: Never Smoker   . Smokeless tobacco: Never Used  . Alcohol Use: No    Allergies:  Allergies  Allergen Reactions  . Diflucan [Fluconazole] Anaphylaxis and Rash  . Ferrous Sulfate Anaphylaxis    Iv dose  . Monistat [Miconazole]     burning  . Tdap [Diphth-Acell Pertussis-Tetanus] Swelling    fever    Prescriptions prior to admission  Medication Sig Dispense Refill  . Boric Acid CRYS 1 each by Does not apply route every 3 (three) days.      . Dextromethorphan-Guaifenesin (MUCINEX DM PO) Take 30 mLs by mouth daily as needed (congestion).      Marland Kitchen DM-Phenylephrine-Acetaminophen (TYLENOL COLD MULTI-SYMPTOM DAY PO) Take 30 mLs by mouth daily as needed (cold).      . Prenatal Vit-Fe Fumarate-FA (PRENATAL MULTIVITAMIN) TABS tablet Take 1 tablet by mouth daily at 12 noon.        Review of Systems  Constitutional: Positive for fever and chills.  HENT: Positive for congestion.  Negative for sore throat.   Respiratory: Positive for cough, sputum production and wheezing (mild).   Gastrointestinal: Positive for diarrhea. Negative for nausea and vomiting.  Genitourinary: Positive for urgency. Negative for dysuria.  Musculoskeletal: Positive for myalgias.  Skin: Negative for rash.  Neurological: Positive for headaches.  All other systems reviewed and are negative.   Physical Exam   Blood pressure 125/73, pulse 104, temperature 98 F (36.7 C), temperature source Oral, resp. rate 18, height 5\' 2"  (1.575 m), weight 67.042 kg (147 lb 12.8 oz), last menstrual period 12/02/2012, SpO2 100.00%.  Physical Exam  Constitutional: She is oriented to person, place, and time. She appears well-developed and well-nourished.  HENT:  Head: Normocephalic.  Mouth/Throat: Oropharynx is clear and moist. Mucous membranes are not dry. No posterior oropharyngeal erythema or tonsillar abscesses.  Neck: Normal range of motion. Neck supple.  Cardiovascular: Normal rate, regular rhythm and normal heart sounds.   Respiratory: Effort normal and breath sounds normal. She has no wheezes. She has no rales.  GI: Soft. There is no tenderness.  Genitourinary: No bleeding around the vagina.  Musculoskeletal: Normal range of motion.  Neurological: She is alert and oriented to person, place, and time.  Skin: Skin  is warm and dry.    MAU Course  Procedures Results for orders placed during the hospital encounter of 06/24/13 (from the past 24 hour(s))  URINALYSIS, ROUTINE W REFLEX MICROSCOPIC     Status: None   Collection Time    06/24/13  5:35 PM      Result Value Range   Color, Urine YELLOW  YELLOW   APPearance CLEAR  CLEAR   Specific Gravity, Urine 1.020  1.005 - 1.030   pH 6.0  5.0 - 8.0   Glucose, UA NEGATIVE  NEGATIVE mg/dL   Hgb urine dipstick NEGATIVE  NEGATIVE   Bilirubin Urine NEGATIVE  NEGATIVE   Ketones, ur NEGATIVE  NEGATIVE mg/dL   Protein, ur NEGATIVE  NEGATIVE mg/dL    Urobilinogen, UA 0.2  0.0 - 1.0 mg/dL   Nitrite NEGATIVE  NEGATIVE   Leukocytes, UA NEGATIVE  NEGATIVE    Assessment and Plan   32 yo G4P3003 at [redacted]w[redacted]d wks IUP Viral Illness Category I Tracing  Plan: Discharge to home Tamiflu pending RX Tamiflu Increase fluids Tylenol prn fever [facility] 06/24/2013, 5:23 PM

## 2013-06-24 NOTE — MAU Note (Signed)
Patient presents to MAU with c/o fever, chills, and cough since yesterday. States temp at 4pm today was 101.5. Taking mucinex and tylenol. Denies LOV, VB, nor contractions. Reports good fetal movement.

## 2013-06-25 LAB — INFLUENZA PANEL BY PCR (TYPE A & B)
H1N1 flu by pcr: NOT DETECTED
Influenza B By PCR: NEGATIVE

## 2013-06-28 ENCOUNTER — Telehealth: Payer: Self-pay | Admitting: *Deleted

## 2013-06-28 NOTE — Telephone Encounter (Signed)
Pt called nurse line requesting call back. 

## 2013-06-29 NOTE — Telephone Encounter (Signed)
Called pt, pt confirms she visited MAU after call to the unit.  No further problems.

## 2013-06-30 ENCOUNTER — Ambulatory Visit (INDEPENDENT_AMBULATORY_CARE_PROVIDER_SITE_OTHER): Payer: BC Managed Care – PPO | Admitting: Obstetrics & Gynecology

## 2013-06-30 VITALS — BP 104/68 | Temp 97.1°F | Wt 147.0 lb

## 2013-06-30 DIAGNOSIS — Z3483 Encounter for supervision of other normal pregnancy, third trimester: Secondary | ICD-10-CM

## 2013-06-30 DIAGNOSIS — O358XX0 Maternal care for other (suspected) fetal abnormality and damage, not applicable or unspecified: Secondary | ICD-10-CM

## 2013-06-30 LAB — POCT URINALYSIS DIP (DEVICE)
Bilirubin Urine: NEGATIVE
Nitrite: NEGATIVE
Protein, ur: 30 mg/dL — AB
pH: >=9 (ref 5.0–8.0)

## 2013-06-30 LAB — HIV ANTIBODY (ROUTINE TESTING W REFLEX): HIV: NONREACTIVE

## 2013-06-30 LAB — GLUCOSE TOLERANCE, 1 HOUR (50G) W/O FASTING: Glucose, 1 Hour GTT: 101 mg/dL (ref 70–140)

## 2013-06-30 LAB — RPR

## 2013-06-30 LAB — CBC
MCH: 20.2 pg — ABNORMAL LOW (ref 26.0–34.0)
MCV: 64.7 fL — ABNORMAL LOW (ref 78.0–100.0)
Platelets: 121 10*3/uL — ABNORMAL LOW (ref 150–400)
RBC: 3.91 MIL/uL (ref 3.87–5.11)
RDW: 16.8 % — ABNORMAL HIGH (ref 11.5–15.5)

## 2013-06-30 NOTE — Patient Instructions (Signed)
Fetal Monitoring, Fetal Movement Assessment  Fetal movement assessment (FMA) is done by the pregnant woman herself by counting and recording the baby's movements over a certain time period. It is done to see if there are problems with the pregnancy and the baby. Identifying and correcting problems may prevent serious problems from developing with the fetus, including fetal loss. Some pregnancies are complicated by the mother's medical problems. Some of these problems are type 1 diabetes mellitus, high blood pressure and other chronic medical illnesses. This is why it is important to monitor the baby before birth.   OTHER TECHNIQUES OF MONITORING YOUR BABY BEFORE BIRTH  Several tests are in use. These include:   Nonstress test (NST). This test monitors the baby's heart rate when the baby moves.   Contraction stress test (CST). This test monitors the baby's heart rate during a contraction of the uterus.   Fetal biophysical profile (BPP). This measures and evaluates 5 observations of the baby:   The nonstress test.   The baby's breathing.   The baby's movements.   The baby's muscle tone.   The amount of amniotic fluid.   Modified BPP. This measures the volume of fluid in different parts of the amniotic sac (amniotic fluid index) and the results of the nonstress test.   Umbilical artery doppler velocimetry. This evaluates the blood flow through the umbilical cord.  There are several very serious problems that cannot be predicted or detected with any of the fetal monitoring procedures. These problems include separation (abruption) of the placenta or when the fetus chokes on the umbilical cord (umbilical cord accident).  Your caregiver will help you understand your tests and what they mean for you and your baby. It is your responsibility to obtain the results of your test.  LET YOUR CAREGIVER KNOW ABOUT:    Any medications you are taking including prescription and over-the-counter drugs, herbs, eye drops and  creams.   If you have a fever.   If you have an infection.   If you are sick.  RISKS AND COMPLICATIONS   There are no risks or complications to the mother or fetus with FMA.  BEFORE THE PROCEDURE   Do not take medications that may decrease or increase the baby's heart rate and/or movements.   Eat a full meal at least 2 hours before the test.   Do not smoke if you are pregnant. If you smoke, stop at least 2 days before the test. It is best not to smoke at all when you are pregnant.  PROCEDURE  Sometimes, a mother notices her baby moves less before there are problems. Because of this, it is believed that fetal movement checking by the mother (kick counts) is a good way to check the baby before birth.  There are different ways of doing this. Two good ways are:   The woman lies on her side and counts distinct (individual) fetal movements. A feeling of 10 distinct movements in a period of up to 2 hours is considered reassuring. When 10 movements are felt, you may stop counting.   Women are instructed to count fetal movements for 1 hour, three times per week. The count is good if, after one week, it equals or is over the woman's previously established baseline count. If the count is lower, further checking of your baby is needed.  AFTER THE PROCEDURE  You may resume your usual activities.  HOME CARE INSTRUCTIONS    Follow your caregiver's advice and recommendations.     Be aware of your baby's movements. Are they normal, less than usual or more than usual?   Make and keep the rest of your prenatal appointments.  SEEK MEDICAL CARE IF:    You develop a temperature of 100 F (37.8 C) or higher.   You have a bloody mucus discharge from the vagina (a bloody show).  SEEK IMMEDIATE MEDICAL CARE IF:    You do not feel the baby move.   You think the baby's movements are too little or too many.   You develop contractions.   You develop vaginal bleeding.   You have belly (abdominal) pain.   You have leaking or a  gush of fluid from the vagina.  Document Released: 06/07/2002 Document Revised: 09/09/2011 Document Reviewed: 10/10/2008  [employer] Patient Information 2014 [employer].

## 2013-06-30 NOTE — Progress Notes (Signed)
Pt with no complaints.  No LOF or VB on exam.  FHR sounded low initially difficult to assess whether it was mother or baby.  Pt placed on monitor for NST.  NST 120's Cat I.  No decels. Appropriate for GA. Reviewed [facility]

## 2013-06-30 NOTE — Progress Notes (Signed)
Pulse- 85 Patient reports braxton hicks contractions

## 2013-07-01 NOTE — L&D Delivery Note (Signed)
`````  Attestation of Attending Supervision of Advanced Practitioner: Evaluation and management procedures were performed by the PA/NP/CNM/OB Fellow under my supervision/collaboration. Chart reviewed and agree with management and plan.  Montrelle Eddings V 09/07/2013 11:37 PM

## 2013-07-01 NOTE — L&D Delivery Note (Signed)
Delivery Note At 4:56 AM a viable female was delivered via Vaginal, Spontaneous Delivery (Presentation: Left Occiput Anterior).  APGAR: crying at perineum, handed to waiting NICU team Placenta status: Intact, Spontaneous.  Cord: 3 vessels with the following complications: None.  Cord pH: Not sent  Anesthesia: Epidural  Episiotomy: None Lacerations: None Suture Repair: NA Est. Blood Loss (mL): 300  Mom to postpartum.  Baby to Couplet care / Skin to Skin.  Called to room for delivery. Pt complete and pushed 4 contractions. Pt delivered NSVD over intact pernieum with spontaneous cry on abdomen. Father cut cord and infant handed to waiting NICU team. No tears. Hemostatic, EBL 300. Counts correct.  Tina [name] 09/02/2013, 5:07 AM

## 2013-07-02 ENCOUNTER — Encounter: Payer: Self-pay | Admitting: Obstetrics & Gynecology

## 2013-07-05 ENCOUNTER — Telehealth: Payer: Self-pay

## 2013-07-05 NOTE — Telephone Encounter (Signed)
Called pt. And informed her of anemia and the need to start on ferrous sulfate tabs. Informed pt. She can get these OTC at any pharmacy. Pt. Verbalized understanding and had no other questions or concerns.

## 2013-07-05 NOTE — Telephone Encounter (Signed)
Message copied by Geanie Logan on Mon Jul 05, 2013  1:39 PM ------      Message from: Lavonia Drafts      Created: Fri Jul 02, 2013  9:36 AM       Pt is anemic.  Please call and ask her to begin FeSO4 tid.            Thx,      clh-S ------

## 2013-07-07 ENCOUNTER — Encounter: Payer: Self-pay | Admitting: *Deleted

## 2013-07-08 ENCOUNTER — Other Ambulatory Visit: Payer: Self-pay | Admitting: Family Medicine

## 2013-07-08 DIAGNOSIS — O35BXX1 Maternal care for other (suspected) fetal abnormality and damage, fetal cardiac anomalies, fetus 1: Secondary | ICD-10-CM

## 2013-07-08 DIAGNOSIS — O358XX1 Maternal care for other (suspected) fetal abnormality and damage, fetus 1: Secondary | ICD-10-CM

## 2013-07-12 ENCOUNTER — Ambulatory Visit (HOSPITAL_COMMUNITY): Payer: BC Managed Care – PPO

## 2013-07-12 ENCOUNTER — Ambulatory Visit (HOSPITAL_COMMUNITY)
Admission: RE | Admit: 2013-07-12 | Discharge: 2013-07-12 | Disposition: A | Discharge status: Home / Self Care | Payer: BC Managed Care – PPO | Source: Ambulatory Visit | Attending: Obstetrics and Gynecology | Admitting: Obstetrics and Gynecology

## 2013-07-12 DIAGNOSIS — O358XX0 Maternal care for other (suspected) fetal abnormality and damage, not applicable or unspecified: Secondary | ICD-10-CM | POA: Insufficient documentation

## 2013-07-12 DIAGNOSIS — O358XX1 Maternal care for other (suspected) fetal abnormality and damage, fetus 1: Secondary | ICD-10-CM

## 2013-07-12 DIAGNOSIS — O35BXX1 Maternal care for other (suspected) fetal abnormality and damage, fetal cardiac anomalies, fetus 1: Secondary | ICD-10-CM

## 2013-07-15 ENCOUNTER — Encounter: Payer: BC Managed Care – PPO | Admitting: Obstetrics and Gynecology

## 2013-07-20 ENCOUNTER — Ambulatory Visit (INDEPENDENT_AMBULATORY_CARE_PROVIDER_SITE_OTHER): Payer: BC Managed Care – PPO | Admitting: Advanced Practice Midwife

## 2013-07-20 ENCOUNTER — Encounter: Payer: Self-pay | Admitting: Advanced Practice Midwife

## 2013-07-20 VITALS — BP 110/74 | Temp 98.2°F | Wt 147.8 lb

## 2013-07-20 DIAGNOSIS — O0930 Supervision of pregnancy with insufficient antenatal care, unspecified trimester: Secondary | ICD-10-CM

## 2013-07-20 DIAGNOSIS — O35BXX0 Maternal care for other (suspected) fetal abnormality and damage, fetal cardiac anomalies, not applicable or unspecified: Secondary | ICD-10-CM

## 2013-07-20 DIAGNOSIS — O358XX0 Maternal care for other (suspected) fetal abnormality and damage, not applicable or unspecified: Secondary | ICD-10-CM

## 2013-07-20 LAB — POCT URINALYSIS DIP (DEVICE)
BILIRUBIN URINE: NEGATIVE
GLUCOSE, UA: NEGATIVE mg/dL
HGB URINE DIPSTICK: NEGATIVE
KETONES UR: 15 mg/dL — AB
Leukocytes, UA: NEGATIVE
NITRITE: NEGATIVE
PH: 5.5 (ref 5.0–8.0)
Protein, ur: NEGATIVE mg/dL
Urobilinogen, UA: 0.2 mg/dL (ref 0.0–1.0)

## 2013-07-20 NOTE — Progress Notes (Signed)
P= 85 C/o of intermittent lower abdominal/pelvic pressure and braxton hicks.

## 2013-07-20 NOTE — Patient Instructions (Signed)
Third Trimester of Pregnancy The third trimester is from week 29 through week 42, months 7 through 9. The third trimester is a time when the fetus is growing rapidly. At the end of the ninth month, the fetus is about 20 inches in length and weighs 6 10 pounds.  BODY CHANGES Your body goes through many changes during pregnancy. The changes vary from woman to woman.   Your weight will continue to increase. You can expect to gain 25 35 pounds (11 16 kg) by the end of the pregnancy.  You may begin to get stretch marks on your hips, abdomen, and breasts.  You may urinate more often because the fetus is moving lower into your pelvis and pressing on your bladder.  You may develop or continue to have heartburn as a result of your pregnancy.  You may develop constipation because certain hormones are causing the muscles that push waste through your intestines to slow down.  You may develop hemorrhoids or swollen, bulging veins (varicose veins).  You may have pelvic pain because of the weight gain and pregnancy hormones relaxing your joints between the bones in your pelvis. Back aches may result from over exertion of the muscles supporting your posture.  Your breasts will continue to grow and be tender. A yellow discharge may leak from your breasts called colostrum.  Your belly button may stick out.  You may feel short of breath because of your expanding uterus.  You may notice the fetus "dropping," or moving lower in your abdomen.  You may have a bloody mucus discharge. This usually occurs a few days to a week before labor begins.  Your cervix becomes thin and soft (effaced) near your due date. WHAT TO EXPECT AT YOUR PRENATAL EXAMS  You will have prenatal exams every 2 weeks until week 36. Then, you will have weekly prenatal exams. During a routine prenatal visit:  You will be weighed to make sure you and the fetus are growing normally.  Your blood pressure is taken.  Your abdomen will be  measured to track your baby's growth.  The fetal heartbeat will be listened to.  Any test results from the previous visit will be discussed.  You may have a cervical check near your due date to see if you have effaced. At around 36 weeks, your caregiver will check your cervix. At the same time, your caregiver will also perform a test on the secretions of the vaginal tissue. This test is to determine if a type of bacteria, Group B streptococcus, is present. Your caregiver will explain this further. Your caregiver may ask you:  What your birth plan is.  How you are feeling.  If you are feeling the baby move.  If you have had any abnormal symptoms, such as leaking fluid, bleeding, severe headaches, or abdominal cramping.  If you have any questions. Other tests or screenings that may be performed during your third trimester include:  Blood tests that check for low iron levels (anemia).  Fetal testing to check the health, activity level, and growth of the fetus. Testing is done if you have certain medical conditions or if there are problems during the pregnancy. FALSE LABOR You may feel small, irregular contractions that eventually go away. These are called Braxton Hicks contractions, or false labor. Contractions may last for hours, days, or even weeks before true labor sets in. If contractions come at regular intervals, intensify, or become painful, it is best to be seen by your caregiver.  SIGNS OF LABOR   Menstrual-like cramps.  Contractions that are 5 minutes apart or less.  Contractions that start on the top of the uterus and spread down to the lower abdomen and back.  A sense of increased pelvic pressure or back pain.  A watery or bloody mucus discharge that comes from the vagina. If you have any of these signs before the 37th week of pregnancy, call your caregiver right away. You need to go to the hospital to get checked immediately. HOME CARE INSTRUCTIONS   Avoid all  smoking, herbs, alcohol, and unprescribed drugs. These chemicals affect the formation and growth of the baby.  Follow your caregiver's instructions regarding medicine use. There are medicines that are either safe or unsafe to take during pregnancy.  Exercise only as directed by your caregiver. Experiencing uterine cramps is a good sign to stop exercising.  Continue to eat regular, healthy meals.  Wear a good support bra for breast tenderness.  Do not use hot tubs, steam rooms, or saunas.  Wear your seat belt at all times when driving.  Avoid raw meat, uncooked cheese, cat litter boxes, and soil used by cats. These carry germs that can cause birth defects in the baby.  Take your prenatal vitamins.  Try taking a stool softener (if your caregiver approves) if you develop constipation. Eat more high-fiber foods, such as fresh vegetables or fruit and whole grains. Drink plenty of fluids to keep your urine clear or pale yellow.  Take warm sitz baths to soothe any pain or discomfort caused by hemorrhoids. Use hemorrhoid cream if your caregiver approves.  If you develop varicose veins, wear support hose. Elevate your feet for 15 minutes, 3 4 times a day. Limit salt in your diet.  Avoid heavy lifting, wear low heal shoes, and practice good posture.  Rest a lot with your legs elevated if you have leg cramps or low back pain.  Visit your dentist if you have not gone during your pregnancy. Use a soft toothbrush to brush your teeth and be gentle when you floss.  A sexual relationship may be continued unless your caregiver directs you otherwise.  Do not travel far distances unless it is absolutely necessary and only with the approval of your caregiver.  Take prenatal classes to understand, practice, and ask questions about the labor and delivery.  Make a trial run to the hospital.  Pack your hospital bag.  Prepare the baby's nursery.  Continue to go to all your prenatal visits as directed  by your caregiver. SEEK MEDICAL CARE IF:  You are unsure if you are in labor or if your water has broken.  You have dizziness.  You have mild pelvic cramps, pelvic pressure, or nagging pain in your abdominal area.  You have persistent nausea, vomiting, or diarrhea.  You have a bad smelling vaginal discharge.  You have pain with urination. SEEK IMMEDIATE MEDICAL CARE IF:   You have a fever.  You are leaking fluid from your vagina.  You have spotting or bleeding from your vagina.  You have severe abdominal cramping or pain.  You have rapid weight loss or gain.  You have shortness of breath with chest pain.  You notice sudden or extreme swelling of your face, hands, ankles, feet, or legs.  You have not felt your baby move in over an hour.  You have severe headaches that do not go away with medicine.  You have vision changes. Document Released: 06/11/2001 Document Revised: 02/17/2013 Document Reviewed:   You have severe abdominal cramping or pain.   You have rapid weight loss or gain.   You have shortness of breath with chest pain.   You notice sudden or extreme swelling of your face, hands, ankles, feet, or legs.   You have not felt your baby move in over an hour.   You have severe headaches that do not go away with medicine.   You have vision changes.  Document Released: 06/11/2001 Document Revised: 02/17/2013 Document Reviewed: 08/18/2012  [employer] Patient Information 2014 [employer].

## 2013-07-20 NOTE — Progress Notes (Signed)
Doing well 

## 2013-08-03 ENCOUNTER — Ambulatory Visit (INDEPENDENT_AMBULATORY_CARE_PROVIDER_SITE_OTHER): Payer: BC Managed Care – PPO | Admitting: Family Medicine

## 2013-08-03 VITALS — BP 125/75 | Temp 97.0°F | Wt 151.9 lb

## 2013-08-03 DIAGNOSIS — O35BXX0 Maternal care for other (suspected) fetal abnormality and damage, fetal cardiac anomalies, not applicable or unspecified: Secondary | ICD-10-CM

## 2013-08-03 DIAGNOSIS — O358XX0 Maternal care for other (suspected) fetal abnormality and damage, not applicable or unspecified: Secondary | ICD-10-CM

## 2013-08-03 NOTE — Progress Notes (Signed)
Pulse- 108  Pressure-pelvic/vaginal

## 2013-08-03 NOTE — Progress Notes (Signed)
S: 34 yo G4P3003 @ [redacted]w[redacted]d today - no ctx, lof, vb. +FM - has f/u US scheduled next week  O: see flowsheet  A/P - f/u in 1 week at 36 weeks - cultures at that time - PTL precautions discussed.

## 2013-08-04 LAB — POCT URINALYSIS DIP (DEVICE)
BILIRUBIN URINE: NEGATIVE
Glucose, UA: NEGATIVE mg/dL
HGB URINE DIPSTICK: NEGATIVE
KETONES UR: NEGATIVE mg/dL
Nitrite: NEGATIVE
PROTEIN: NEGATIVE mg/dL
SPECIFIC GRAVITY, URINE: 1.02 (ref 1.005–1.030)
Urobilinogen, UA: 0.2 mg/dL (ref 0.0–1.0)
pH: 6.5 (ref 5.0–8.0)

## 2013-08-05 ENCOUNTER — Other Ambulatory Visit: Payer: Self-pay | Admitting: Family Medicine

## 2013-08-05 DIAGNOSIS — O358XX0 Maternal care for other (suspected) fetal abnormality and damage, not applicable or unspecified: Secondary | ICD-10-CM

## 2013-08-05 DIAGNOSIS — O35BXX0 Maternal care for other (suspected) fetal abnormality and damage, fetal cardiac anomalies, not applicable or unspecified: Secondary | ICD-10-CM

## 2013-08-09 ENCOUNTER — Ambulatory Visit (HOSPITAL_COMMUNITY)
Admission: RE | Admit: 2013-08-09 | Discharge: 2013-08-09 | Disposition: A | Discharge status: Home / Self Care | Payer: BC Managed Care – PPO | Source: Ambulatory Visit | Attending: Obstetrics and Gynecology | Admitting: Obstetrics and Gynecology

## 2013-08-09 DIAGNOSIS — O35BXX0 Maternal care for other (suspected) fetal abnormality and damage, fetal cardiac anomalies, not applicable or unspecified: Secondary | ICD-10-CM

## 2013-08-09 DIAGNOSIS — O358XX0 Maternal care for other (suspected) fetal abnormality and damage, not applicable or unspecified: Secondary | ICD-10-CM

## 2013-08-09 NOTE — Progress Notes (Signed)
[facility] ultrasound  Indication: 33 yr old G67P3003 at [redacted]w[redacted]d with fetus with AV canal defect for follow up ultrasound.  Findings: 1. Single intrauterine pregnancy. 2. Estimated fetal weight is in the 16th%. 3. Anterior placenta without evidence of previa. 4. Normal amniotic fluid index. 5. 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 6. The remainder of the limited anatomy survey is normal.  Recommendations: 1. Appropriate fetal growth - head measurements are somewhat lagging; however there has been appropriate interval growth and head measurements are not 2SD below the mean 2. Fetal heart defect: - previously counseled - followed by Pediatric Cardiology - had normal fetal karyotype - recommend delivery at 39-40 weeks - recommend weekly evaluation of fetal doptones given previous episode of fetal bradycardia 3. Recommend follow up ultrasound in 2 weeks  [place], MD

## 2013-08-09 NOTE — Addendum Note (Signed)
Encounter addended by: Clarene Duke, RN on: 08/09/2013 11:51 AM<BR>     Documentation filed: Charges VN

## 2013-08-17 ENCOUNTER — Encounter: Payer: BC Managed Care – PPO | Admitting: Family

## 2013-08-24 ENCOUNTER — Telehealth: Payer: Self-pay

## 2013-08-24 NOTE — Telephone Encounter (Signed)
Pt. Called stating she would like to talk to the nurse. Called pt. And asked her to explain what her concern was. Pt states "My leg is like numb on and off. My back hurts. I don't know. I know I have a hemorrhoid. It bleeds sometimes when I wipe. Im having lots of braxton hicks, sometimes 4 an hour. I am just uncomfortable." Informed pt. These are all normal late in pregnancy. Reviewed signs of active labor with pt. And advised her to continue monitoring her body, staying well hydrated, resting and elevating feet and legs when possible. Pt. Has appointment tomorrow morning and stated she will be here. Informed pt. We can address any other concerns tomorrow. Pt. Verbalized understanding and had no other questions or concerns at this time.

## 2013-08-25 ENCOUNTER — Encounter: Payer: Self-pay | Admitting: Obstetrics and Gynecology

## 2013-08-25 ENCOUNTER — Ambulatory Visit (HOSPITAL_COMMUNITY)
Admission: RE | Admit: 2013-08-25 | Discharge: 2013-08-25 | Disposition: A | Discharge status: Home / Self Care | Payer: BC Managed Care – PPO | Source: Ambulatory Visit | Attending: Family Medicine | Admitting: Family Medicine

## 2013-08-25 ENCOUNTER — Ambulatory Visit (INDEPENDENT_AMBULATORY_CARE_PROVIDER_SITE_OTHER): Payer: BC Managed Care – PPO | Admitting: Obstetrics and Gynecology

## 2013-08-25 ENCOUNTER — Encounter: Payer: Self-pay | Admitting: Obstetrics & Gynecology

## 2013-08-25 VITALS — BP 108/68 | Temp 97.7°F | Wt 152.2 lb

## 2013-08-25 VITALS — BP 113/74 | HR 93 | Wt 152.0 lb

## 2013-08-25 DIAGNOSIS — O358XX0 Maternal care for other (suspected) fetal abnormality and damage, not applicable or unspecified: Secondary | ICD-10-CM

## 2013-08-25 DIAGNOSIS — O0930 Supervision of pregnancy with insufficient antenatal care, unspecified trimester: Secondary | ICD-10-CM

## 2013-08-25 DIAGNOSIS — O35BXX0 Maternal care for other (suspected) fetal abnormality and damage, fetal cardiac anomalies, not applicable or unspecified: Secondary | ICD-10-CM

## 2013-08-25 DIAGNOSIS — K649 Unspecified hemorrhoids: Secondary | ICD-10-CM

## 2013-08-25 LAB — POCT URINALYSIS DIP (DEVICE)
Bilirubin Urine: NEGATIVE
Glucose, UA: NEGATIVE mg/dL
HGB URINE DIPSTICK: NEGATIVE
KETONES UR: NEGATIVE mg/dL
Leukocytes, UA: NEGATIVE
Nitrite: NEGATIVE
PH: 6 (ref 5.0–8.0)
PROTEIN: NEGATIVE mg/dL
Specific Gravity, Urine: 1.025 (ref 1.005–1.030)
UROBILINOGEN UA: 1 mg/dL (ref 0.0–1.0)

## 2013-08-25 LAB — OB RESULTS CONSOLE GC/CHLAMYDIA
CHLAMYDIA, DNA PROBE: NEGATIVE
GC PROBE AMP, GENITAL: NEGATIVE

## 2013-08-25 LAB — OB RESULTS CONSOLE GBS: GBS: NEGATIVE

## 2013-08-25 MED ORDER — DOCUSATE SODIUM 100 MG PO CAPS: 100.0000 mg | ORAL_CAPSULE | Freq: Every day | ORAL | Status: DC | PRN

## 2013-08-25 MED ORDER — HYDROCORTISONE ACETATE 25 MG RE SUPP: 25.0000 mg | Freq: Two times a day (BID) | RECTAL | Status: DC

## 2013-08-25 NOTE — Progress Notes (Signed)
Fetus active. Asking about timing for delivery. Has [place] today>will follow MFM recommendation. Hemorhhoid bleeds > Tucks, Colace, Anusol HC. Letter for work. Soft fleshy ext hemorrhoid, no blood seen. Cultures done.

## 2013-08-25 NOTE — Progress Notes (Signed)
P= 96 C/o of intermittent lower abdomnial/pelvic pain and pressure, lower back pain. C/o of numbness/tingling in right leg and has been continuous for the last few days.

## 2013-08-25 NOTE — Addendum Note (Signed)
Addended by: Novella Olive on: 08/25/2013 11:51 AM   Modules accepted: Orders

## 2013-08-26 LAB — GC/CHLAMYDIA PROBE AMP
CT Probe RNA: NEGATIVE
GC Probe RNA: NEGATIVE

## 2013-08-27 LAB — CULTURE, BETA STREP (GROUP B ONLY)

## 2013-08-30 ENCOUNTER — Telehealth (HOSPITAL_COMMUNITY): Payer: Self-pay | Admitting: *Deleted

## 2013-08-30 ENCOUNTER — Encounter (HOSPITAL_COMMUNITY): Payer: Self-pay | Admitting: *Deleted

## 2013-08-30 NOTE — Telephone Encounter (Signed)
Preadmission screen  

## 2013-09-01 ENCOUNTER — Inpatient Hospital Stay (HOSPITAL_COMMUNITY)
Admission: RE | Admit: 2013-09-01 | Discharge: 2013-09-04 | DRG: 767 | Disposition: A | Discharge status: Home / Self Care | Payer: BC Managed Care – PPO | Source: Ambulatory Visit | Attending: Obstetrics and Gynecology | Admitting: Obstetrics and Gynecology

## 2013-09-01 ENCOUNTER — Encounter (HOSPITAL_COMMUNITY): Payer: Self-pay

## 2013-09-01 ENCOUNTER — Encounter: Payer: BC Managed Care – PPO | Admitting: Family Medicine

## 2013-09-01 VITALS — BP 110/73 | HR 63 | Temp 97.9°F | Resp 18 | Ht 61.5 in | Wt 152.0 lb

## 2013-09-01 DIAGNOSIS — O358XX0 Maternal care for other (suspected) fetal abnormality and damage, not applicable or unspecified: Principal | ICD-10-CM | POA: Diagnosis present

## 2013-09-01 DIAGNOSIS — O0940 Supervision of pregnancy with grand multiparity, unspecified trimester: Secondary | ICD-10-CM

## 2013-09-01 DIAGNOSIS — O35BXX0 Maternal care for other (suspected) fetal abnormality and damage, fetal cardiac anomalies, not applicable or unspecified: Secondary | ICD-10-CM

## 2013-09-01 DIAGNOSIS — O0930 Supervision of pregnancy with insufficient antenatal care, unspecified trimester: Secondary | ICD-10-CM

## 2013-09-01 DIAGNOSIS — Z302 Encounter for sterilization: Secondary | ICD-10-CM

## 2013-09-01 LAB — CBC
HEMATOCRIT: 24.9 % — AB (ref 36.0–46.0)
Hemoglobin: 7 g/dL — ABNORMAL LOW (ref 12.0–15.0)
MCH: 17.7 pg — ABNORMAL LOW (ref 26.0–34.0)
MCHC: 28.1 g/dL — ABNORMAL LOW (ref 30.0–36.0)
MCV: 63 fL — AB (ref 78.0–100.0)
Platelets: 140 10*3/uL — ABNORMAL LOW (ref 150–400)
RBC: 3.95 MIL/uL (ref 3.87–5.11)
RDW: 18.5 % — ABNORMAL HIGH (ref 11.5–15.5)
WBC: 4.8 10*3/uL (ref 4.0–10.5)

## 2013-09-01 LAB — TYPE AND SCREEN
ABO/RH(D): A POS
ANTIBODY SCREEN: NEGATIVE

## 2013-09-01 LAB — RPR: RPR Ser Ql: NONREACTIVE

## 2013-09-01 LAB — ABO/RH: ABO/RH(D): A POS

## 2013-09-01 MED ORDER — DIPHENHYDRAMINE HCL 50 MG/ML IJ SOLN: 12.5000 mg | INTRAMUSCULAR | Status: DC | PRN

## 2013-09-01 MED ORDER — PHENYLEPHRINE 40 MCG/ML (10ML) SYRINGE FOR IV PUSH (FOR BLOOD PRESSURE SUPPORT)
80.0000 ug | PREFILLED_SYRINGE | INTRAVENOUS | Status: DC | PRN
  Filled 2013-09-01: qty 2
  Filled 2013-09-01: qty 10

## 2013-09-01 MED ORDER — OXYTOCIN 40 UNITS IN LACTATED RINGERS INFUSION - SIMPLE MED
1.0000 m[IU]/min | INTRAVENOUS | Status: DC
  Administered 2013-09-01: 2 m[IU]/min via INTRAVENOUS
  Filled 2013-09-01: qty 1000

## 2013-09-01 MED ORDER — LACTATED RINGERS IV SOLN: 500.0000 mL | Freq: Once | INTRAVENOUS | Status: DC

## 2013-09-01 MED ORDER — PHENYLEPHRINE 40 MCG/ML (10ML) SYRINGE FOR IV PUSH (FOR BLOOD PRESSURE SUPPORT)
80.0000 ug | PREFILLED_SYRINGE | INTRAVENOUS | Status: DC | PRN
  Filled 2013-09-01: qty 2

## 2013-09-01 MED ORDER — LACTATED RINGERS IV SOLN
INTRAVENOUS | Status: DC
  Administered 2013-09-01 – 2013-09-02 (×2): via INTRAVENOUS

## 2013-09-01 MED ORDER — FENTANYL 2.5 MCG/ML BUPIVACAINE 1/10 % EPIDURAL INFUSION (WH - ANES)
14.0000 mL/h | INTRAMUSCULAR | Status: DC | PRN
  Filled 2013-09-01: qty 125

## 2013-09-01 MED ORDER — OXYCODONE-ACETAMINOPHEN 5-325 MG PO TABS: 1.0000 | ORAL_TABLET | ORAL | Status: DC | PRN

## 2013-09-01 MED ORDER — ACETAMINOPHEN 325 MG PO TABS: 650.0000 mg | ORAL_TABLET | ORAL | Status: DC | PRN

## 2013-09-01 MED ORDER — EPHEDRINE 5 MG/ML INJ
10.0000 mg | INTRAVENOUS | Status: DC | PRN
  Filled 2013-09-01: qty 4
  Filled 2013-09-01: qty 2

## 2013-09-01 MED ORDER — ONDANSETRON HCL 4 MG/2ML IJ SOLN
4.0000 mg | Freq: Four times a day (QID) | INTRAMUSCULAR | Status: DC | PRN
Start: 2013-09-01 — End: 2013-09-02

## 2013-09-01 MED ORDER — LACTATED RINGERS IV SOLN: 500.0000 mL | INTRAVENOUS | Status: DC | PRN

## 2013-09-01 MED ORDER — FLEET ENEMA 7-19 GM/118ML RE ENEM: 1.0000 | ENEMA | RECTAL | Status: DC | PRN

## 2013-09-01 MED ORDER — EPHEDRINE 5 MG/ML INJ
10.0000 mg | INTRAVENOUS | Status: DC | PRN
  Filled 2013-09-01: qty 2

## 2013-09-01 MED ORDER — CITRIC ACID-SODIUM CITRATE 334-500 MG/5ML PO SOLN: 30.0000 mL | ORAL | Status: DC | PRN

## 2013-09-01 MED ORDER — OXYTOCIN BOLUS FROM INFUSION
500.0000 mL | INTRAVENOUS | Status: DC
  Administered 2013-09-02: 500 mL via INTRAVENOUS

## 2013-09-01 MED ORDER — IBUPROFEN 600 MG PO TABS: 600.0000 mg | ORAL_TABLET | Freq: Four times a day (QID) | ORAL | Status: DC | PRN

## 2013-09-01 MED ORDER — OXYTOCIN 40 UNITS IN LACTATED RINGERS INFUSION - SIMPLE MED: 62.5000 mL/h | INTRAVENOUS | Status: DC

## 2013-09-01 MED ORDER — LIDOCAINE HCL (PF) 1 % IJ SOLN
30.0000 mL | INTRAMUSCULAR | Status: DC | PRN
  Filled 2013-09-01: qty 30

## 2013-09-01 NOTE — H&P (Signed)
Tina [name] is a 33 y.o. female [phone] @ [redacted]w[redacted]d presenting for induction of labor for fetal AV septal defect, discovered early in pregnancy and followed with frequent U/S and echocardiogram. Pediatric cardiology and MFM recommend she should be induced at 39 weeks. 3rd trimesteter EFW of 16th%.   Maternal Medical History:  Reason for admission: Nausea.  Contractions: Frequency: rare.   Perceived severity is mild.    Fetal activity: Perceived fetal activity is normal.   Last perceived fetal movement was within the past hour.    Prenatal complications: No bleeding, hypertension, infection, IUGR, placental abnormality, pre-eclampsia, preterm labor or substance abuse.   Prenatal Complications - Diabetes: none.    OB History   Grav Para Term Preterm Abortions TAB SAB Ect Mult Living   4 3 3  0 0 0 0 0 0 3     Past Medical History  Diagnosis Date  . Ovarian cyst   . Anemia   . Infection     UTI  . Abnormal Pap smear     f/u was ok  . Vaginal Pap smear, abnormal    Past Surgical History  Procedure Laterality Date  . No past surgeries     Family History: family history includes Asthma in her mother; Cancer in her maternal grandmother and paternal grandfather; Hyperlipidemia in her father; Hypertension in her father. Social History:  reports that she has never smoked. She has never used smokeless tobacco. She reports that she does not drink alcohol or use illicit drugs.   Prenatal Transfer Tool  Maternal Diabetes: No Genetic Screening: Normal Maternal Ultrasounds/Referrals: Abnormal:  Findings:   Fetal Heart Anomalies, Other: Fetal Ultrasounds or other Referrals:  Fetal echo, Other:  Maternal Substance Abuse:  No Significant Maternal Medications:  None Significant Maternal Lab Results:  Lab values include: Group B Strep negative Other Comments:  As above,  AV septal defect, RV > LV  Review of Systems  Constitutional: Negative for fever, chills and weight loss.  Eyes:  Negative for blurred vision.  Respiratory: Negative for cough and shortness of breath.   Cardiovascular: Negative for chest pain.  Gastrointestinal: Positive for heartburn. Negative for nausea and vomiting.  Genitourinary: Negative for dysuria.  Neurological: Negative for headaches.    Dilation: Fingertip Effacement (%): 60 Station: -2 Exam by:: leftwich-kireby, CNM Blood pressure 130/74, pulse 86, temperature 98.8 F (37.1 C), temperature source Oral, height 5' 1.5" (1.562 m), weight 68.947 kg (152 lb), last menstrual period 12/02/2012. Maternal Exam:  Uterine Assessment: Contraction strength is mild.  Contraction frequency is rare.   Abdomen: Patient reports no abdominal tenderness. Fetal presentation: vertex  Introitus: Normal vulva. Normal vagina.  Ferning test: not done.  Nitrazine test: not done. Amniotic fluid character: not assessed.  Pelvis: adequate for delivery.   Cervix: Cervix evaluated by digital exam.     Fetal Exam Fetal Monitor Review: Mode: ultrasound.   Baseline rate: 135.  Variability: minimal (<5 bpm).   Pattern: no decelerations.    Fetal State Assessment: Category I - tracings are normal.     Physical Exam  Prenatal labs: ABO, Rh: --/--/A POS (03/04 0857) Antibody: NEG (03/04 0830) Rubella: 2.63 (10/01 1435) RPR: NON REAC (12/31 1151)  HBsAg: NEGATIVE (10/01 1435)  HIV: NON REACTIVE (12/31 1151)  GBS: Negative (02/25 0000)  Glucose Tolerance Test (1h) : 101   Assessment/Plan: - Fetal Heart Defect  # Dr. Roselie Awkward discussed with Dr. Leverne Humbles of [facility] cardiology, plan for IOL # MFM aware and following for  fetal cardiac defect  - GBS negative  --IOL with Foley bulb    Jacques Earthly 09/01/2013, 12:20 PM

## 2013-09-01 NOTE — Progress Notes (Signed)
Tina [name] is a 33 y.o. [phone] at [redacted]w[redacted]d admitted for induction of labor due to A-V septal defect.  Dr Roselie Awkward discussed fetal echo findings with cardiologist, and with neonatologist and pt cleared to delivery at [facility] as expected prognosis for baby is good, with plans for surgical intervention at 67 month of age..  Subjective: Pt comfortable, denies cramping/contractions.  Pt husband at bedside for support.   Objective: BP 124/71  Pulse 84  Temp(Src) 98.8 F (37.1 C) (Oral)  Ht 5' 1.5" (1.562 m)  Wt 68.947 kg (152 lb)  BMI 28.26 kg/m2  LMP 12/02/2012      FHT:  FHR: 125 bpm, variability: moderate,  accelerations:  Present,  decelerations:  Absent UC:   none SVE:   Dilation: Fingertip Effacement (%): 60 Station: -2 Exam by:: leftwich-kireby, CNM Foley bulb placed without difficulty and filled to 60 ml by RN.  Pt tolerated well.  Labs: Lab Results  Component Value Date   WBC 4.8 09/01/2013   HGB 7.0* 09/01/2013   HCT 24.9* 09/01/2013   MCV 63.0* 09/01/2013   PLT 140* 09/01/2013    Assessment / Plan: Induction of labor due to fetal cardiac anomaly Foley bulb in place  Labor: Progressing normally Preeclampsia:  n/a Fetal Wellbeing:  Category I Pain Control:  Labor support without medications I/D:  n/a Anticipated MOD:  NSVD  Tina [name] 09/01/2013, 1:30 PM

## 2013-09-01 NOTE — Progress Notes (Signed)
Tina [name] is a 33 y.o. [phone] at [redacted]w[redacted]d admitted for induction of labor due to fetal A-V septal heart defect  Subjective: No complaints   Objective: BP 133/81  Pulse 98  Temp(Src) 98.4 F (36.9 C) (Oral)  Resp 18  Ht 5' 1.5" (1.562 m)  Wt 68.947 kg (152 lb)  BMI 28.26 kg/m2  LMP 12/02/2012    FHT:  FHR: 130 bpm, variability: moderate,  accelerations:  Abscent,  decelerations:  Absent UC:   irregular SVE:   Dilation: Fingertip Effacement (%): 60 Station: -2 Exam by:: leftwich-kireby, CNM  Labs: Lab Results  Component Value Date   WBC 4.8 09/01/2013   HGB 7.0* 09/01/2013   HCT 24.9* 09/01/2013   MCV 63.0* 09/01/2013   PLT 140* 09/01/2013   Assessment / Plan: Induction of labor due to  fetal A-V septal heart defect,  progressing well on pitocin  Labor: Progressing normally and FB in place; starting pit Fetal Wellbeing:  Category I Pain Control:  epidural on request I/D:  gbs neg Anticipated MOD:  NSVD  Phill Myron 09/01/2013, 9:53 PM

## 2013-09-02 ENCOUNTER — Encounter (HOSPITAL_COMMUNITY): Payer: Self-pay

## 2013-09-02 ENCOUNTER — Encounter (HOSPITAL_COMMUNITY): Payer: BC Managed Care – PPO

## 2013-09-02 ENCOUNTER — Inpatient Hospital Stay (HOSPITAL_COMMUNITY): Payer: BC Managed Care – PPO

## 2013-09-02 ENCOUNTER — Encounter (HOSPITAL_COMMUNITY)
Admission: RE | Disposition: A | Discharge status: Home / Self Care | Payer: Self-pay | Source: Ambulatory Visit | Attending: Obstetrics and Gynecology

## 2013-09-02 ENCOUNTER — Inpatient Hospital Stay (HOSPITAL_COMMUNITY): Payer: BC Managed Care – PPO | Admitting: Anesthesiology

## 2013-09-02 ENCOUNTER — Encounter (HOSPITAL_COMMUNITY): Payer: BC Managed Care – PPO | Admitting: Anesthesiology

## 2013-09-02 ENCOUNTER — Encounter: Payer: Self-pay | Admitting: Family Medicine

## 2013-09-02 DIAGNOSIS — O358XX0 Maternal care for other (suspected) fetal abnormality and damage, not applicable or unspecified: Secondary | ICD-10-CM

## 2013-09-02 DIAGNOSIS — O0940 Supervision of pregnancy with grand multiparity, unspecified trimester: Secondary | ICD-10-CM

## 2013-09-02 HISTORY — PX: TUBAL LIGATION: SHX77

## 2013-09-02 LAB — SURGICAL PCR SCREEN
MRSA, PCR: NEGATIVE
Staphylococcus aureus: NEGATIVE

## 2013-09-02 SURGERY — LIGATION, FALLOPIAN TUBE, POSTPARTUM
Anesthesia: Epidural | Site: Abdomen | Laterality: Bilateral

## 2013-09-02 MED ORDER — FENTANYL CITRATE 0.05 MG/ML IJ SOLN
INTRAMUSCULAR | Status: DC | PRN
  Administered 2013-09-02 (×2): 50 ug via INTRAVENOUS

## 2013-09-02 MED ORDER — ONDANSETRON HCL 4 MG/2ML IJ SOLN: 4.0000 mg | INTRAMUSCULAR | Status: DC | PRN

## 2013-09-02 MED ORDER — LIDOCAINE-EPINEPHRINE (PF) 2 %-1:200000 IJ SOLN
INTRAMUSCULAR | Status: AC
  Filled 2013-09-02: qty 20

## 2013-09-02 MED ORDER — LACTATED RINGERS IV SOLN
INTRAVENOUS | Status: DC
  Administered 2013-09-02: 14:00:00 via INTRAVENOUS

## 2013-09-02 MED ORDER — FENTANYL CITRATE 0.05 MG/ML IJ SOLN
100.0000 ug | INTRAMUSCULAR | Status: DC | PRN
  Administered 2013-09-02: 100 ug via INTRAVENOUS
  Filled 2013-09-02: qty 2

## 2013-09-02 MED ORDER — SODIUM BICARBONATE 8.4 % IV SOLN
INTRAVENOUS | Status: AC
  Filled 2013-09-02: qty 50

## 2013-09-02 MED ORDER — ONDANSETRON HCL 4 MG PO TABS
4.0000 mg | ORAL_TABLET | ORAL | Status: DC | PRN
  Administered 2013-09-02: 4 mg via ORAL
  Filled 2013-09-02: qty 1

## 2013-09-02 MED ORDER — MIDAZOLAM HCL 2 MG/2ML IJ SOLN
INTRAMUSCULAR | Status: DC | PRN
  Administered 2013-09-02 (×2): 1 mg via INTRAVENOUS

## 2013-09-02 MED ORDER — SIMETHICONE 80 MG PO CHEW: 80.0000 mg | CHEWABLE_TABLET | ORAL | Status: DC | PRN

## 2013-09-02 MED ORDER — 0.9 % SODIUM CHLORIDE (POUR BTL) OPTIME
TOPICAL | Status: DC | PRN
  Administered 2013-09-02: 1000 mL

## 2013-09-02 MED ORDER — ONDANSETRON HCL 4 MG/2ML IJ SOLN
INTRAMUSCULAR | Status: DC | PRN
Start: 2013-09-02 — End: 2013-09-02
  Administered 2013-09-02: 4 mg via INTRAVENOUS

## 2013-09-02 MED ORDER — LANOLIN HYDROUS EX OINT: TOPICAL_OINTMENT | CUTANEOUS | Status: DC | PRN

## 2013-09-02 MED ORDER — BUPIVACAINE HCL (PF) 0.25 % IJ SOLN
INTRAMUSCULAR | Status: DC | PRN
  Administered 2013-09-02: 5 mL

## 2013-09-02 MED ORDER — FENTANYL CITRATE 0.05 MG/ML IJ SOLN: 25.0000 ug | INTRAMUSCULAR | Status: DC | PRN

## 2013-09-02 MED ORDER — WITCH HAZEL-GLYCERIN EX PADS: 1.0000 "application " | MEDICATED_PAD | CUTANEOUS | Status: DC | PRN

## 2013-09-02 MED ORDER — DIPHENHYDRAMINE HCL 25 MG PO CAPS: 25.0000 mg | ORAL_CAPSULE | Freq: Four times a day (QID) | ORAL | Status: DC | PRN

## 2013-09-02 MED ORDER — ZOLPIDEM TARTRATE 5 MG PO TABS: 5.0000 mg | ORAL_TABLET | Freq: Every evening | ORAL | Status: DC | PRN

## 2013-09-02 MED ORDER — FAMOTIDINE 20 MG PO TABS
40.0000 mg | ORAL_TABLET | Freq: Once | ORAL | Status: AC
  Administered 2013-09-02: 40 mg via ORAL
  Filled 2013-09-02: qty 2

## 2013-09-02 MED ORDER — BENZOCAINE-MENTHOL 20-0.5 % EX AERO: 1.0000 "application " | INHALATION_SPRAY | CUTANEOUS | Status: DC | PRN

## 2013-09-02 MED ORDER — OXYCODONE-ACETAMINOPHEN 5-325 MG PO TABS
1.0000 | ORAL_TABLET | ORAL | Status: DC | PRN
  Administered 2013-09-02: 1 via ORAL
  Administered 2013-09-02 (×2): 2 via ORAL
  Administered 2013-09-03 (×2): 1 via ORAL
  Administered 2013-09-03 – 2013-09-04 (×4): 2 via ORAL
  Filled 2013-09-02: qty 1
  Filled 2013-09-02 (×4): qty 2
  Filled 2013-09-02: qty 1
  Filled 2013-09-02 (×2): qty 2
  Filled 2013-09-02: qty 1

## 2013-09-02 MED ORDER — FENTANYL 2.5 MCG/ML BUPIVACAINE 1/10 % EPIDURAL INFUSION (WH - ANES)
INTRAMUSCULAR | Status: DC | PRN
  Administered 2013-09-02: 14 mL/h via EPIDURAL

## 2013-09-02 MED ORDER — SODIUM BICARBONATE 8.4 % IV SOLN
INTRAVENOUS | Status: DC | PRN
  Administered 2013-09-02: 2 mL via EPIDURAL
  Administered 2013-09-02: 3 mL via EPIDURAL
  Administered 2013-09-02: 2 mL via EPIDURAL
  Administered 2013-09-02: 3 mL via EPIDURAL
  Administered 2013-09-02: 2 mL via EPIDURAL
  Administered 2013-09-02: 3 mL via EPIDURAL
  Administered 2013-09-02: 5 mL via EPIDURAL

## 2013-09-02 MED ORDER — LACTATED RINGERS IV SOLN
INTRAVENOUS | Status: DC | PRN
  Administered 2013-09-02 (×2): via INTRAVENOUS

## 2013-09-02 MED ORDER — METOCLOPRAMIDE HCL 10 MG PO TABS
10.0000 mg | ORAL_TABLET | Freq: Once | ORAL | Status: AC
  Administered 2013-09-02: 10 mg via ORAL
  Filled 2013-09-02: qty 1

## 2013-09-02 MED ORDER — LIDOCAINE HCL (PF) 1 % IJ SOLN
INTRAMUSCULAR | Status: DC | PRN
  Administered 2013-09-02 (×2): 9 mL

## 2013-09-02 MED ORDER — SENNOSIDES-DOCUSATE SODIUM 8.6-50 MG PO TABS
2.0000 | ORAL_TABLET | ORAL | Status: DC
  Administered 2013-09-02 – 2013-09-03 (×2): 2 via ORAL
  Filled 2013-09-02 (×2): qty 2

## 2013-09-02 MED ORDER — DIBUCAINE 1 % RE OINT: 1.0000 "application " | TOPICAL_OINTMENT | RECTAL | Status: DC | PRN

## 2013-09-02 MED ORDER — IBUPROFEN 600 MG PO TABS
600.0000 mg | ORAL_TABLET | Freq: Four times a day (QID) | ORAL | Status: DC
  Administered 2013-09-02 – 2013-09-04 (×10): 600 mg via ORAL
  Filled 2013-09-02 (×10): qty 1

## 2013-09-02 SURGICAL SUPPLY — 19 items
CLIP FILSHIE TUBAL LIGA STRL (Clip) ×3 IMPLANT
CLOTH BEACON ORANGE TIMEOUT ST (SAFETY) ×3 IMPLANT
DRSG COVADERM PLUS 2X2 (GAUZE/BANDAGES/DRESSINGS) ×3 IMPLANT
DURAPREP 26ML APPLICATOR (WOUND CARE) ×3 IMPLANT
GLOVE BIO SURGEON STRL SZ7 (GLOVE) ×3 IMPLANT
GLOVE BIOGEL PI IND STRL 7.0 (GLOVE) ×1 IMPLANT
GLOVE BIOGEL PI INDICATOR 7.0 (GLOVE) ×2
GOWN STRL REUS W/TWL LRG LVL3 (GOWN DISPOSABLE) ×6 IMPLANT
NEEDLE HYPO 22GX1.5 SAFETY (NEEDLE) ×3 IMPLANT
NS IRRIG 1000ML POUR BTL (IV SOLUTION) ×3 IMPLANT
PACK ABDOMINAL MINOR (CUSTOM PROCEDURE TRAY) ×3 IMPLANT
SPONGE LAP 4X18 X RAY DECT (DISPOSABLE) ×3 IMPLANT
SUT VIC AB 0 CT1 27 (SUTURE) ×2
SUT VIC AB 0 CT1 27XBRD ANBCTR (SUTURE) ×1 IMPLANT
SUT VICRYL 4-0 PS2 18IN ABS (SUTURE) ×3 IMPLANT
SYR CONTROL 10ML LL (SYRINGE) ×3 IMPLANT
TOWEL OR 17X24 6PK STRL BLUE (TOWEL DISPOSABLE) ×6 IMPLANT
TRAY FOLEY CATH 14FR (SET/KITS/TRAYS/PACK) ×3 IMPLANT
WATER STERILE IRR 1000ML POUR (IV SOLUTION) IMPLANT

## 2013-09-02 NOTE — Progress Notes (Signed)
Tina [name] is a 33 y.o. [phone] at [redacted]w[redacted]d admitted for induction of labor due to fetal A-V septal heart defect  Subjective: No complaints  Objective: BP 124/66  Pulse 92  Temp(Src) 98.3 F (36.8 C) (Oral)  Resp 18  Ht 5' 1.5" (1.562 m)  Wt 68.947 kg (152 lb)  BMI 28.26 kg/m2  LMP 12/02/2012     FHT:  FHR: 140 bpm, variability: moderate,  accelerations:  Abscent,  decelerations:  Absent UC:   none SVE:   Dilation: Fingertip (Foley Bulb in place) Effacement (%): 60 Station: -2 Exam by:: [employer] RN  Labs: Lab Results  Component Value Date   WBC 4.8 09/01/2013   HGB 7.0* 09/01/2013   HCT 24.9* 09/01/2013   MCV 63.0* 09/01/2013   PLT 140* 09/01/2013   Assessment / Plan: induction of labor due to fetal A-V septal heart defect  Labor: Progressing on Pitocin, will continue to increase then AROM FB in place Fetal Wellbeing:  Category I Pain Control:  Labor support without medications I/D:  gbs neg Anticipated MOD:  NSVD  Phill Myron 09/02/2013, 2:48 AM

## 2013-09-02 NOTE — Anesthesia Preprocedure Evaluation (Signed)
Anesthesia Evaluation  Patient identified by MRN, date of birth, ID band Patient awake    Reviewed: Allergy & Precautions, H&P , NPO status , Patient's Chart, lab work & pertinent test results  Airway Mallampati: II TM Distance: >3 FB Neck ROM: full    Dental no notable dental hx.    Pulmonary neg pulmonary ROS,    Pulmonary exam normal       Cardiovascular negative cardio ROS      Neuro/Psych negative neurological ROS  negative psych ROS   GI/Hepatic negative GI ROS, Neg liver ROS,   Endo/Other  negative endocrine ROS  Renal/GU negative Renal ROS     Musculoskeletal   Abdominal Normal abdominal exam  (+)   Peds  Hematology   Anesthesia Other Findings   Reproductive/Obstetrics (+) Pregnancy                           Anesthesia Physical Anesthesia Plan  ASA: II  Anesthesia Plan: Epidural   Post-op Pain Management:    Induction:   Airway Management Planned:   Additional Equipment:   Intra-op Plan:   Post-operative Plan:   Informed Consent: I have reviewed the patients History and Physical, chart, labs and discussed the procedure including the risks, benefits and alternatives for the proposed anesthesia with the patient or authorized representative who has indicated his/her understanding and acceptance.     Plan Discussed with:   Anesthesia Plan Comments:         Anesthesia Quick Evaluation  

## 2013-09-02 NOTE — Anesthesia Procedure Notes (Signed)
Epidural Patient location during procedure: OB Start time: 09/02/2013 4:32 AM End time: 09/02/2013 4:36 AM  Staffing Anesthesiologist: Lyn Hollingshead Performed by: anesthesiologist   Preanesthetic Checklist Completed: patient identified, surgical consent, pre-op evaluation, timeout performed, IV checked, risks and benefits discussed and monitors and equipment checked  Epidural Patient position: sitting Prep: site prepped and draped and DuraPrep Patient monitoring: continuous pulse ox and blood pressure Approach: midline Location: L4-L5 Injection technique: LOR air  Needle:  Needle type: Tuohy  Needle gauge: 17 G Needle length: 9 cm and 9 Needle insertion depth: 7 cm Catheter type: closed end flexible Catheter size: 19 Gauge Catheter at skin depth: 12 cm Test dose: negative and Other  Assessment Sensory level: T9 Events: blood not aspirated, injection not painful, no injection resistance, negative IV test and no paresthesia  Additional Notes Reason for block:procedure for pain

## 2013-09-02 NOTE — Transfer of Care (Signed)
Immediate Anesthesia Transfer of Care Note  Patient: Tina [name]  Procedure(s) Performed: Procedure(s): POST PARTUM TUBAL LIGATION (Bilateral)  Patient Location: PACU  Anesthesia Type:Epidural  Level of Consciousness: awake, alert , oriented and patient cooperative  Airway & Oxygen Therapy: Patient Spontanous Breathing  Post-op Assessment: Report given to PACU RN and Post -op Vital signs reviewed and stable  Post vital signs: Reviewed and stable  Complications: No apparent anesthesia complications

## 2013-09-02 NOTE — Anesthesia Postprocedure Evaluation (Signed)
  Anesthesia Post-op Note  Patient: Tina [name]  Procedure(s) Performed: Procedure(s): POST PARTUM TUBAL LIGATION (Bilateral)  Patient is awake, responsive, moving her legs, and has signs of resolution of her numbness. Pain and nausea are reasonably well controlled. Vital signs are stable and clinically acceptable. Oxygen saturation is clinically acceptable. There are no apparent anesthetic complications at this time. Patient is ready for discharge.

## 2013-09-02 NOTE — Anesthesia Postprocedure Evaluation (Signed)
  Anesthesia Post-op Note  Patient: Tina [name]  Procedure(s) Performed: * No procedures listed *  Patient Location: Mother/Baby  Anesthesia Type:Epidural  Level of Consciousness: awake  Airway and Oxygen Therapy: Patient Spontanous Breathing  Post-op Pain: none  Post-op Assessment: Patient's Cardiovascular Status Stable, Respiratory Function Stable, Patent Airway, No signs of Nausea or vomiting, Adequate PO intake, Pain level controlled, No headache, No backache, No residual numbness and No residual motor weakness  Post-op Vital Signs: Reviewed and stable  Complications: No apparent anesthesia complications

## 2013-09-03 ENCOUNTER — Encounter (HOSPITAL_COMMUNITY): Payer: Self-pay | Admitting: Obstetrics & Gynecology

## 2013-09-03 DIAGNOSIS — Z302 Encounter for sterilization: Secondary | ICD-10-CM

## 2013-09-03 LAB — CBC
HEMATOCRIT: 23.7 % — AB (ref 36.0–46.0)
HEMOGLOBIN: 6.7 g/dL — AB (ref 12.0–15.0)
MCH: 18 pg — AB (ref 26.0–34.0)
MCHC: 28.3 g/dL — AB (ref 30.0–36.0)
MCV: 63.5 fL — AB (ref 78.0–100.0)
Platelets: 154 10*3/uL (ref 150–400)
RBC: 3.73 MIL/uL — ABNORMAL LOW (ref 3.87–5.11)
RDW: 18.8 % — AB (ref 11.5–15.5)
WBC: 11.7 10*3/uL — ABNORMAL HIGH (ref 4.0–10.5)

## 2013-09-03 MED ORDER — FERROUS GLUCONATE 324 (38 FE) MG PO TABS
324.0000 mg | ORAL_TABLET | Freq: Three times a day (TID) | ORAL | Status: DC
  Administered 2013-09-04 (×2): 324 mg via ORAL
  Filled 2013-09-03 (×4): qty 1

## 2013-09-03 NOTE — Progress Notes (Signed)
Post Partum Day 1  Subjective: no complaints Pt states that she is doing well today. She had an SVD yesterday at 0456 after being admitted for induction of labor d/t fetal AV septal heart defect. She had a tubal ligation yesterday, and has had no complications with this. Pt has had mild post-op pain which was been well controlled with ibuprofen and percocet. She is drinking, eating, and voiding well. Pt has not had a BM since the SVD however has had flatus. No calf pain. Pt is currently breastfeeding. She would like to stay an extra day and be discharged tomorrow.  Objective: Blood pressure 98/61, pulse 72, temperature 98 F (36.7 C), temperature source Oral, resp. rate 18, height 5' 1.5" (1.562 m), weight 68.947 kg (152 lb), last menstrual period 12/02/2012, SpO2 100.00%, unknown if currently breastfeeding.  Physical Exam:  General: alert and cooperative Lochia: appropriate Uterine Fundus: firm Incision: healing well DVT Evaluation: No evidence of DVT seen on physical exam.   Recent Labs  09/01/13 0830 09/03/13 0738  HGB 7.0* 6.7*  HCT 24.9* 23.7*    Assessment/Plan: Plan for discharge tomorrow and Breastfeeding Will start iron now.  LOS: 2 days   Jeanett Schlein T 09/03/2013, 8:56 AM   I spoke with and examined patient and agree with PA-S's note and plan of care.  Fredrik Rigger, MD Ob Fellow 09/03/2013 9:12 AM

## 2013-09-03 NOTE — Op Note (Signed)
Tina [name] 09/01/2013 - 09/02/2013  PREOPERATIVE DIAGNOSIS:  Multiparity, undesired fertility  POSTOPERATIVE DIAGNOSIS:  Multiparity, undesired fertility  PROCEDURE:  Postpartum Bilateral Tubal Sterilization using Filshie Clips   SURGEON: Dr.  Silas Sacramento  ANESTHESIA:  Epidural and local analgesia using 6.2% Marcaine  COMPLICATIONS:  None immediate.  ESTIMATED BLOOD LOSS: 5 ml.   INDICATIONS: 33 y.o. G3T5176  with undesired fertility,status post vaginal delivery, desires permanent sterilization.  Other reversible forms of contraception were discussed with patient; she declines all other modalities. Risks of procedure discussed with patient including but not limited to: risk of regret, permanence of method, bleeding, infection, injury to surrounding organs and need for additional procedures.  Failure risk of 0.5-1% with increased risk of ectopic gestation if pregnancy occurs was also discussed with patient.     FINDINGS:  Normal uterus, tubes, and ovaries.  PROCEDURE DETAILS: The patient was taken to the operating room where her epidural anesthesia was dosed up to surgical level and found to be adequate.  She was then placed in the dorsal supine position and prepped and draped in sterile fashion.  After an adequate timeout was performed, attention was turned to the patient's abdomen where a small transverse skin incision was made under the umbilical fold. The incision was taken down to the layer of fascia using the scalpel, and fascia was incised, and extended bilaterally using Mayo scissors. The peritoneum was entered in a sharp fashion. Attention was then turned to the patient's uterus, and left fallopian tube was identified and followed out to the fimbriated end.  A Filshie clip was placed on the left fallopian tube about 3 cm from the cornual attachment, with care given to incorporate the underlying mesosalpinx.  A similar process was carried out on the right side allowing for  bilateral tubal sterilization.  Good hemostasis was noted overall.  Local analgesia was injected into both Filshie application sites.The instruments were then removed from the patient's abdomen and the fascial incision was repaired with 0 Vicryl, and the skin was closed with a 4-0 Vicryl subcuticular stitch. The patient tolerated the procedure well.  Instrument, sponge, and needle counts were correct times two.  The patient was then taken to the recovery room awake and in stable condition.

## 2013-09-03 NOTE — Lactation Note (Signed)
This note was copied from the chart of [name]. Lactation Consultation Note     Follow up consult with this mom and baby, full term, with CHD, 35 hours  Post partum, and weighing 5 lbs 10 oz. He is very sleepy, more than 6 hours post circumcision, would not latch or suckle at breast. I had mom pump, and with hand expression after 15 minutes of pumping in premie setting got 2 drops of colostrum. Mom reports that her milk did not transition in with last baby until day 3. This baby has had 3 voids and 2 stools in last 35 hours, since birth. His mouth feels dry. I tried suck training with a gloved finger, but he refused to suck. He is a tongue sucker. Frenulum noted to be a little before the mid oint of his tongue, no obvious movement restriction noted at this time. Baby has a very tight mouth, - massage of cheeks and jaw helped some to relax his jaw.  I explained to mom that with her minimal EBM, and his cardiac disease, and low birth weight, I felt it was time to begin supplementing with formula. Mom agreed, so I gave her the [facility] feeding amounts guidelines, and offered the baby a bottle of 12 ml's of Neosusre 22 cal. He would not suck, bit the nipple , and fed 2 mls total of formula. 70 nurse, Elmyra Ricks, made aware of above.   Mom is aware she should pump in premie setting, every 3 hours, and provide EBM first to baby, and then offer a bottle of formula, today 7-12 mls, and let him take what he wants. Mom has not been logging feeds and output, so I reviewed this with her also, and stressed the importance of this.    Baby is on the every 3 hours, 8-11-2-5  Feeding schedule, for now. I told mom oto feed him earlier, if he cues.  Mom knows to call for questions/concerns.  Patient Name: Tina [name] [id number] Date: 09/03/2013 Reason for consult: Follow-up assessment;Infant < 6lbs;Other (Comment) (CHD- AV Canal - term baby weighin under 6 lbs)   Maternal Data    Feeding Feeding Type: Breast  Milk  LATCH Score/Interventions Latch: Too sleepy or reluctant, no latch achieved, no sucking elicited. Intervention(s): Skin to skin Intervention(s): Assist with latch     Type of Nipple: Everted at rest and after stimulation  Comfort (Breast/Nipple): Soft / non-tender     Hold (Positioning): Assistance needed to correctly position infant at breast and maintain latch. Intervention(s): Breastfeeding basics reviewed;Support Pillows;Position options;Skin to skin     Lactation Tools Discussed/Used Tools: Pump Breast pump type: Double-Electric Breast Pump WIC Program: Yes (mom has WIC and BC/BS) Pump Review: Setup, frequency, and cleaning;Milk Storage;Other (comment) (premie setting, encouraged to add hand expression after each pumping)   Consult Status Consult Status: Follow-up Date: 09/04/13 Follow-up type: In-patient    Tonna Corner 09/03/2013, 5:24 PM

## 2013-09-03 NOTE — Progress Notes (Signed)
Spoke to Dr. Leslie Andrea to report Hemoglobin level of 6.7.  No new orders given at this time.

## 2013-09-03 NOTE — Anesthesia Postprocedure Evaluation (Signed)
  Anesthesia Post-op Note  Patient: Tina [name]  Procedure(s) Performed: Procedure(s): POST PARTUM TUBAL LIGATION (Bilateral)  Patient Location: PACU and Mother/Baby  Anesthesia Type:General  Level of Consciousness: awake, alert  and oriented  Airway and Oxygen Therapy: Patient Spontanous Breathing  Post-op Pain: mild  Post-op Assessment: Patient's Cardiovascular Status Stable, Respiratory Function Stable, No signs of Nausea or vomiting and Pain level controlled  Post-op Vital Signs: stable  Complications: No apparent anesthesia complications

## 2013-09-03 NOTE — Addendum Note (Signed)
Addendum created 09/03/13 [id number] by Elenore Paddy, CRNA   Modules edited: Notes Section   Notes Section:  File: [id number]

## 2013-09-04 ENCOUNTER — Encounter: Payer: Self-pay | Admitting: *Deleted

## 2013-09-04 MED ORDER — DOCUSATE SODIUM 100 MG PO CAPS: 100.0000 mg | ORAL_CAPSULE | Freq: Two times a day (BID) | ORAL | Status: DC

## 2013-09-04 MED ORDER — IBUPROFEN 600 MG PO TABS: 600.0000 mg | ORAL_TABLET | Freq: Four times a day (QID) | ORAL | Status: DC

## 2013-09-04 MED ORDER — OXYCODONE-ACETAMINOPHEN 5-325 MG PO TABS: 1.0000 | ORAL_TABLET | ORAL | Status: DC | PRN

## 2013-09-04 NOTE — Discharge Instructions (Signed)

## 2013-09-04 NOTE — Discharge Summary (Signed)
  Obstetric Discharge Summary Reason for Admission: induction of labor Prenatal Procedures: NST Intrapartum Procedures: spontaneous vaginal delivery Postpartum Procedures: P.P. tubal ligation Complications-Operative and Postpartum: none  Hospital Course: Admitted for IOL for fetal septal deffect. Pt was induced and progressed normally to have successful vaginal delivery as below. Pt had PPBTL without difficulty. Meeting milestones, pain controlled, no acute issues. Breast feeding and suppletmenting.  Delivery Note At 4:56 AM a viable female was delivered via Vaginal, Spontaneous Delivery (Presentation: Left Occiput Anterior).  APGAR: crying at perineum, handed to waiting NICU team Placenta status: Intact, Spontaneous.  Cord: 3 vessels with the following complications: None.  Cord pH: Not sent  Anesthesia: Epidural  Episiotomy: None Lacerations: None Suture Repair: NA Est. Blood Loss (mL): 300  Mom to postpartum.  Baby to Couplet care / Skin to Skin.  Called to room for delivery. Pt complete and pushed 4 contractions. Pt delivered NSVD over intact pernieum with spontaneous cry on abdomen. Father cut cord and infant handed to waiting NICU team. No tears. Hemostatic, EBL 300. Counts correct.  Fredrik Rigger 09/02/2013, 5:07 AM      H/H: Lab Results  Component Value Date/Time   HGB 6.7* 09/03/2013  7:38 AM   HCT 23.7* 09/03/2013  7:38 AM    Physical Exam: Abd: Appropriately tender, ND, Fundus @U -1 Incision: c/d/i No c/c/e, Neg homan's sign, neg cords Lochia Appropriate  Discharge Diagnoses: Term Pregnancy-delivered  Discharge Information: Date: 01/10/2011 Activity: pelvic rest Diet: routine  Medications: PNV, Ibuprofen, Colace and Percocet Breast feeding:  Yes Condition: stable Instructions: refer to handout Discharge to: home    Future Appointments Provider Department Dept Phone   10/08/2013 11:00 AM Farris Has, PA-C [facility] [phone]        Medication List         Boric Acid Crys  1 each by Does not apply route every 3 (three) days.     docusate sodium 100 MG capsule  Commonly known as:  COLACE  Take 1 capsule (100 mg total) by mouth every 12 (twelve) hours.     hydrocortisone 25 MG suppository  Commonly known as:  ANUSOL-HC  Place 1 suppository (25 mg total) rectally 2 (two) times daily.     ibuprofen 600 MG tablet  Commonly known as:  ADVIL,MOTRIN  Take 1 tablet (600 mg total) by mouth every 6 (six) hours.     oxyCODONE-acetaminophen 5-325 MG per tablet  Commonly known as:  PERCOCET/ROXICET  Take 1-2 tablets by mouth every 4 (four) hours as needed for severe pain (moderate - severe pain).     prenatal multivitamin Tabs tablet  Take 1 tablet by mouth daily at 12 noon.           Follow-up Information   Follow up with [facility]. (as scheduled)    Specialty:  Obstetrics and Gynecology   Contact information:   [place] [address] [phone]      Fredrik Rigger 09/04/2013,9:30 AM

## 2013-09-07 ENCOUNTER — Encounter: Payer: Self-pay | Admitting: *Deleted

## 2013-09-08 ENCOUNTER — Inpatient Hospital Stay (HOSPITAL_COMMUNITY)
Admission: AD | Admit: 2013-09-08 | Payer: BC Managed Care – PPO | Source: Ambulatory Visit | Admitting: Obstetrics & Gynecology

## 2013-09-29 ENCOUNTER — Encounter: Payer: Self-pay | Admitting: *Deleted

## 2013-10-08 ENCOUNTER — Ambulatory Visit: Payer: BC Managed Care – PPO | Admitting: Medical

## 2013-11-04 ENCOUNTER — Encounter: Payer: Self-pay | Admitting: Medical

## 2013-11-04 ENCOUNTER — Ambulatory Visit (INDEPENDENT_AMBULATORY_CARE_PROVIDER_SITE_OTHER): Payer: BC Managed Care – PPO | Admitting: Medical

## 2013-11-04 DIAGNOSIS — R300 Dysuria: Secondary | ICD-10-CM

## 2013-11-04 LAB — POCT URINALYSIS DIP (DEVICE)
Bilirubin Urine: NEGATIVE
Glucose, UA: NEGATIVE mg/dL
Ketones, ur: NEGATIVE mg/dL
LEUKOCYTES UA: NEGATIVE
NITRITE: NEGATIVE
PROTEIN: NEGATIVE mg/dL
Specific Gravity, Urine: 1.01 (ref 1.005–1.030)
Urobilinogen, UA: 0.2 mg/dL (ref 0.0–1.0)
pH: 6.5 (ref 5.0–8.0)

## 2013-11-04 NOTE — Addendum Note (Signed)
Addended by: Farris Has on: 11/04/2013 03:43 PM   Modules accepted: Orders

## 2013-11-04 NOTE — Progress Notes (Signed)
Patient ID: Tina [name], female   DOB: 10-13-1980, 33 y.o.   MRN: [id number] Subjective:     Tina [name] is a 33 y.o. female who presents for a postpartum visit. She is 8 weeks postpartum following a spontaneous vaginal delivery. I have fully reviewed the prenatal and intrapartum course. The delivery was at 39.1 gestational weeks. Outcome: spontaneous vaginal delivery. Anesthesia: epidural. Postpartum course has been normal. Baby's course has been normal. Baby is feeding by both breast and bottle - Similac with Iron. Bleeding staining only. Bowel function is normal. Bladder function is normal. Patient is concerned about UTI. States urgency and discomfort with urination. Patient is sexually active. Contraception method is tubal ligation. Postpartum depression screening: negative.  The following portions of the patient's history were reviewed and updated as appropriate: allergies, current medications, past family history, past medical history, past social history, past surgical history and problem list.  Review of Systems Pertinent items are noted in HPI.   Objective:    There were no vitals taken for this visit.  General:  alert and cooperative   Breasts:  not performed  Lungs: clear to auscultation bilaterally  Heart:  regular rate and rhythm, S1, S2 normal, no murmur, click, rub or gallop  Abdomen: soft, non-tender; bowel sounds normal; no masses,  no organomegaly   Vulva:  normal  Vagina: normal vagina, no discharge, exudate, lesion, or erythema  Cervix:  normal cervix contour, no friability, discharge or bleeding  Corpus: normal size, contour, position, consistency, mobility, non-tender  Adnexa:  no mass, fullness, tenderness  Rectal Exam: Not performed.        Assessment:     Normal postpartum exam. Pap smear not done at today's visit.  Pap smear was normal with negative HRHPV 03/31/13. Next pap smear with cotesting is due 03/31/2018.   Plan:    1. Contraception:  tubal ligation 2. Follow up in: 1 year for annual exam or as needed.

## 2013-11-04 NOTE — Patient Instructions (Signed)

## 2013-11-07 LAB — URINE CULTURE: Colony Count: >=100000

## 2013-11-08 ENCOUNTER — Other Ambulatory Visit: Payer: Self-pay | Admitting: Medical

## 2013-11-08 DIAGNOSIS — N390 Urinary tract infection, site not specified: Principal | ICD-10-CM

## 2013-11-08 DIAGNOSIS — B952 Enterococcus as the cause of diseases classified elsewhere: Secondary | ICD-10-CM

## 2013-11-08 MED ORDER — CEPHALEXIN 500 MG PO CAPS: 500.0000 mg | ORAL_CAPSULE | Freq: Four times a day (QID) | ORAL | Status: DC

## 2013-11-09 NOTE — Progress Notes (Signed)
Called pt. And informed her of UTI and prescription ready at preferred [facility]. Pt. Verbalized understanding and gratitude. No further questions or concerns.

## 2014-04-05 IMAGING — US US OB LIMITED
1 series · 13 of 21 positions shown · non-contrast
Comparison: none

[Series 1: us ob limited · 13 of 21 slices shown]
[im 1/21]
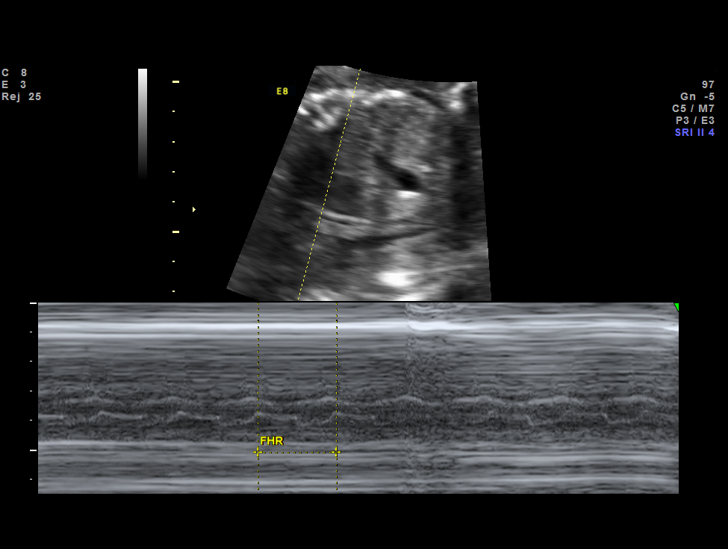
[im 3/21]
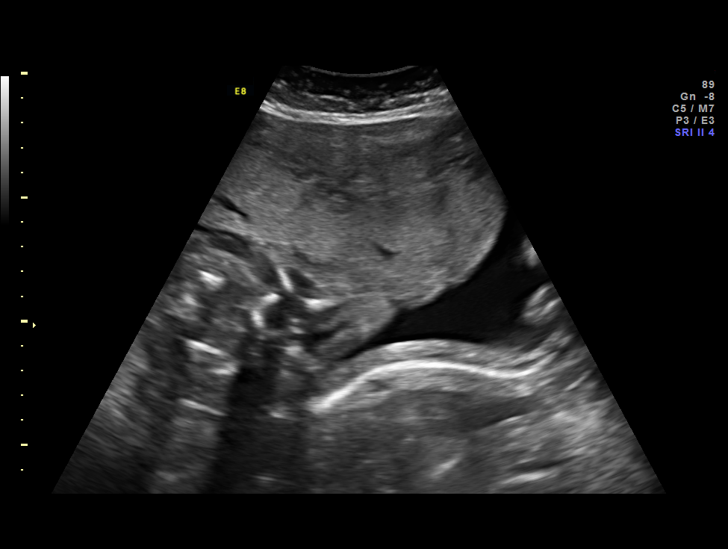
[im 5/21]
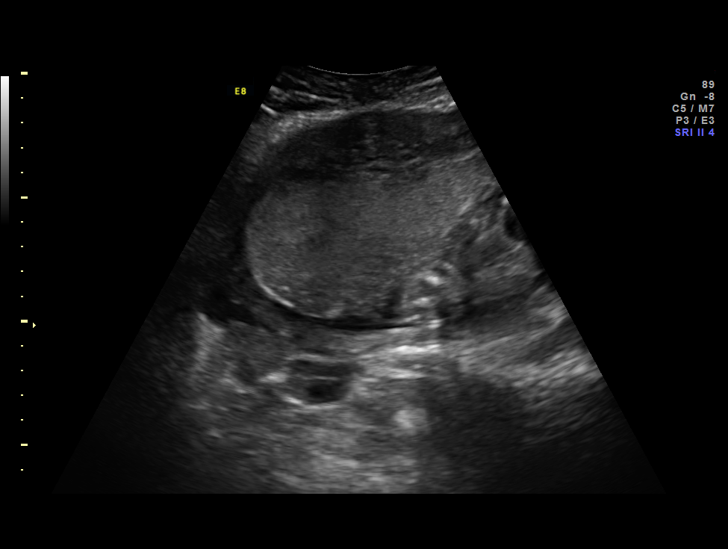
[im 6/21]
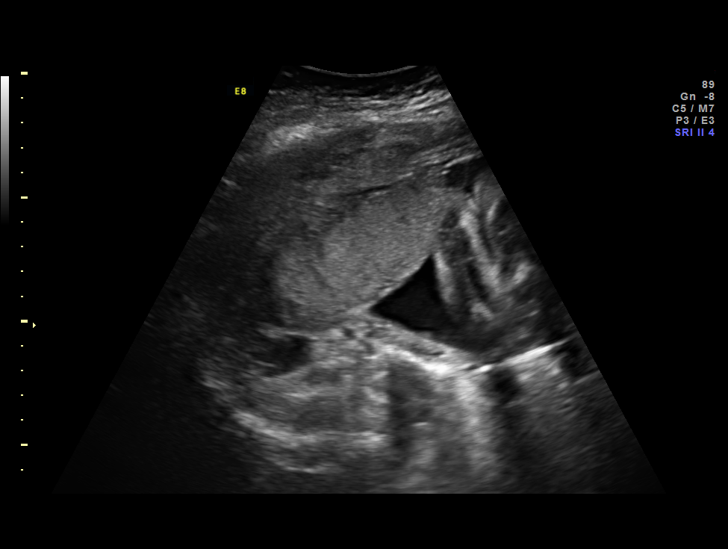
[im 8/21]
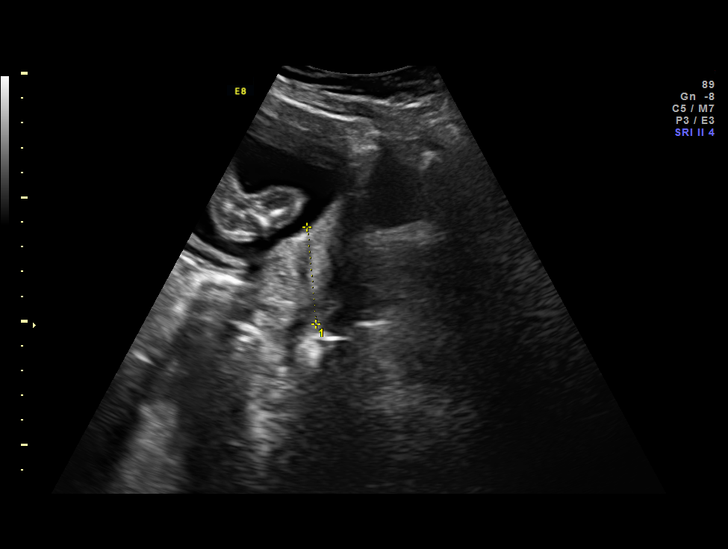
[im 9/21]
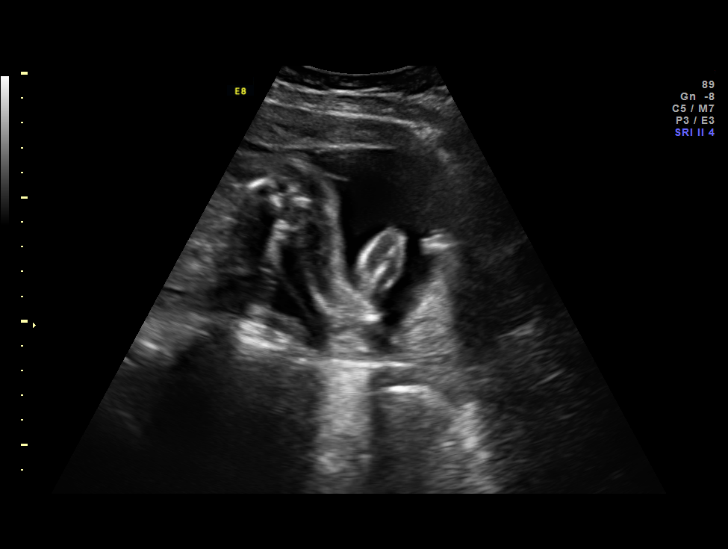
[im 11/21]
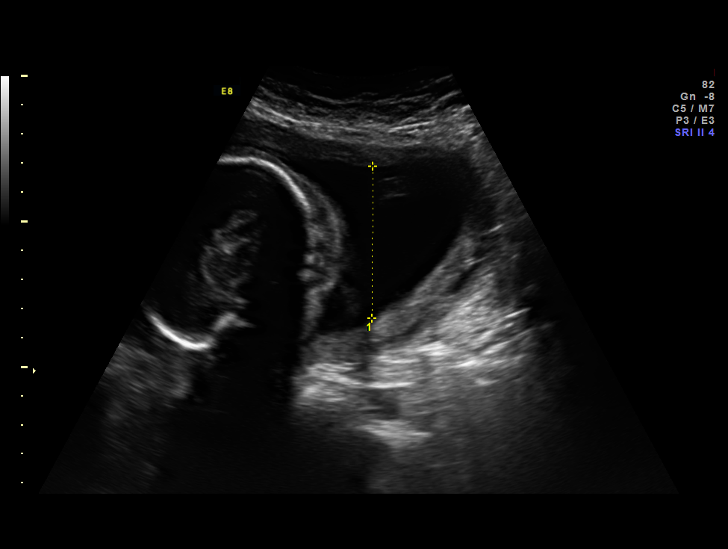
[im 13/21]
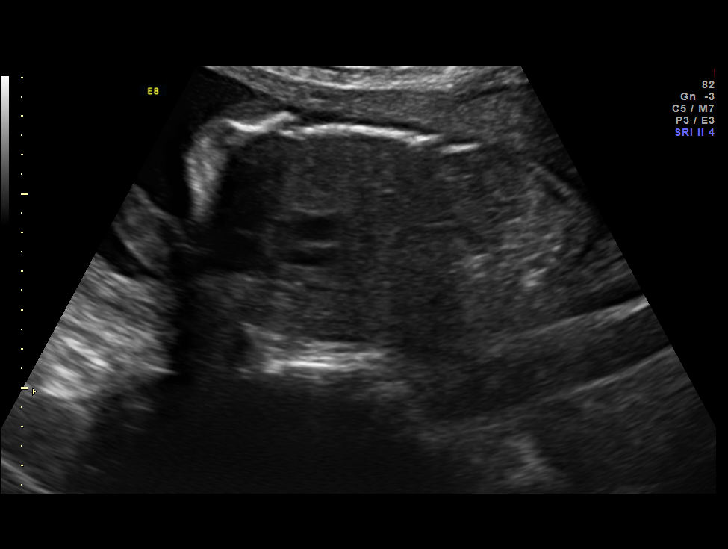
[im 14/21]
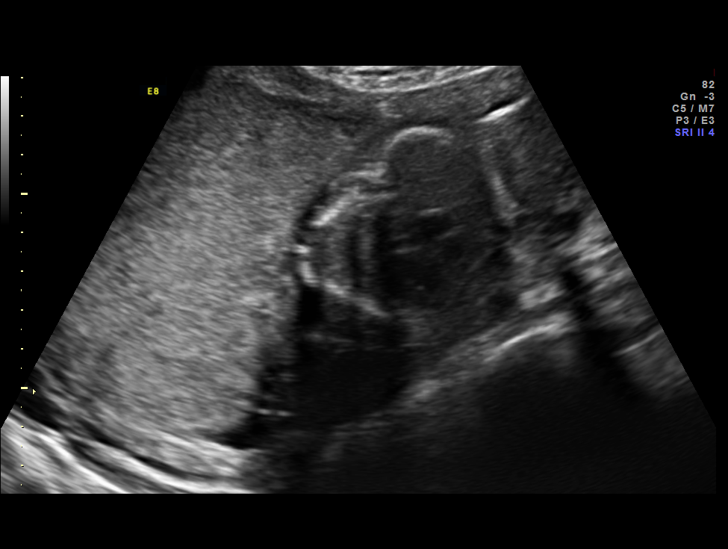
[im 16/21]
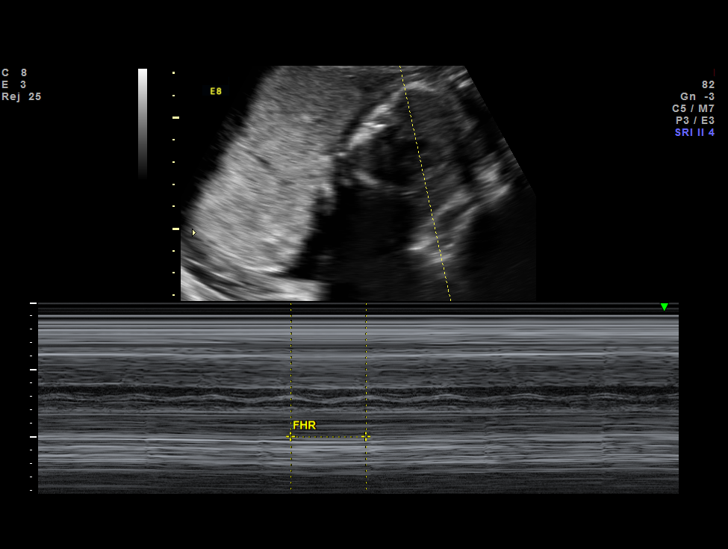
[im 17/21]
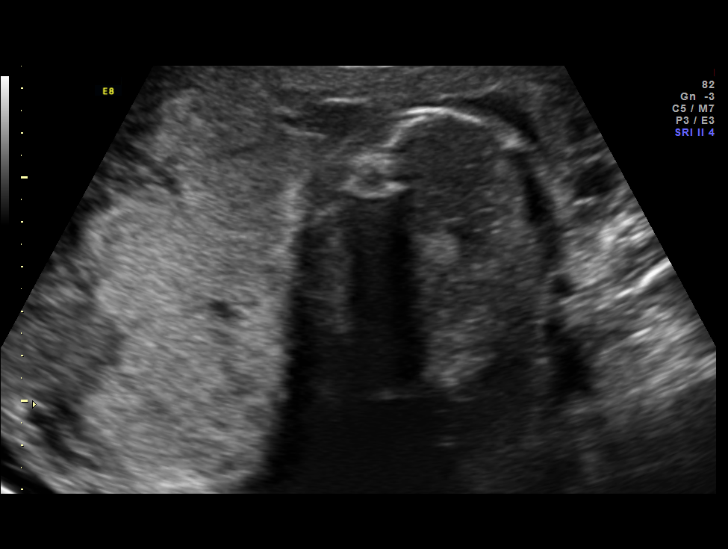
[im 19/21]
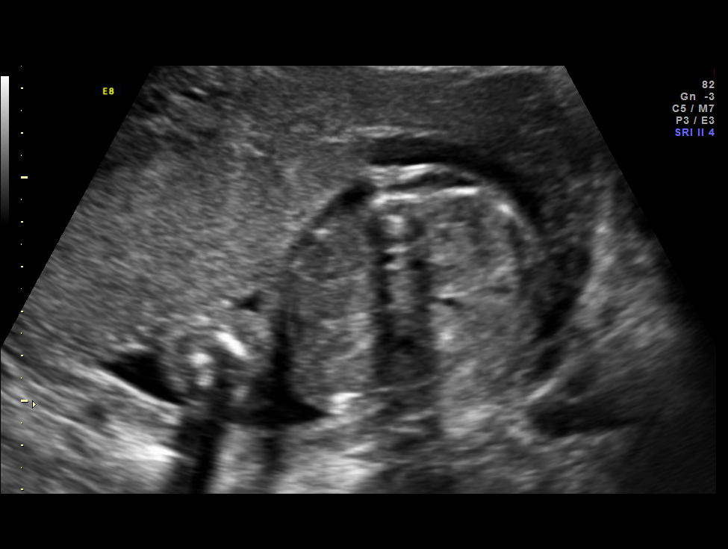
[im 21/21]
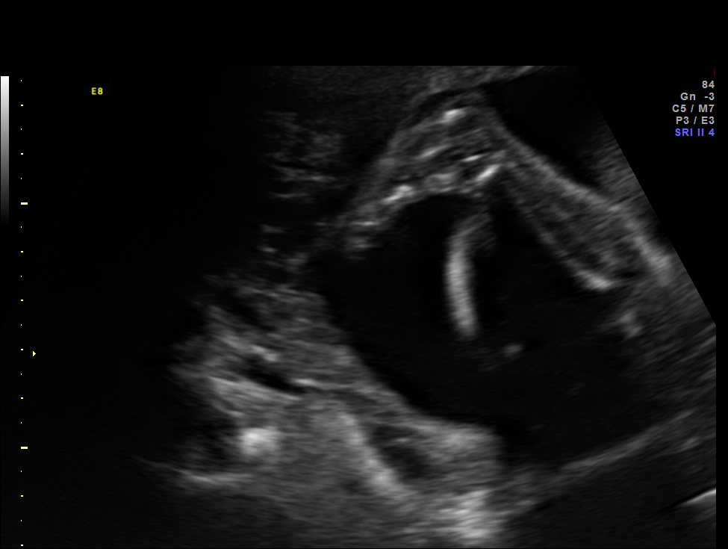

[13 of 21 positions shown; findings below may reference images not displayed]

OBSTETRICS REPORT
                      (Signed Final 05/18/2013 [DATE])

Service(s) Provided

 [HOSPITAL]                                         76815.0
Indications

 Cardiac (heart) anomaly, A/V Defect
 Fetal bradycardia
Fetal Evaluation

 Num Of Fetuses:    1
 Fetal Heart Rate:  129                          bpm
 Cardiac Activity:  Observed
 Presentation:      Breech
 Placenta:          Anterior, above cervical os

 Amniotic Fluid
 AFI FV:      Subjectively within normal limits
                                             Larg Pckt:     5.2  cm
Gestational Age

 LMP:           23w 6d        Date:  12/02/12                 EDD:   09/08/13
 Best:          23w 6d     Det. By:  LMP  (12/02/12)          EDD:   09/08/13
Cervix Uterus Adnexa

 Cervical Length:    3.9      cm

 Cervix:       Normal appearance by transabdominal scan. Appears
               closed, without funnelling.
Impression

 Single IUP at 23 [DATE] weeks
 AV canal defect by fetal echo
 Today's evaluation demonstrates a normal heart rate and
 rhythm, noting fetal echo indicated frequent PACs with atrial
 bigeminy resulting in frequent blocked beats - fetal
 bradycardia to 25's-65's
 Amniocentesis previously performed.  FISH was negative for
 aneuploidy with full karyotype pending
 No evidence of fetal hydrops today
Recommendations

 Ultrasound for growth in 1 week.

## 2014-04-19 IMAGING — US US OB LIMITED
1 series · 14 of 22 positions shown · non-contrast
Comparison: none

[Series 1: us ob limited · 0.23mm/px · 14 of 22 slices shown]
[im 1/22]
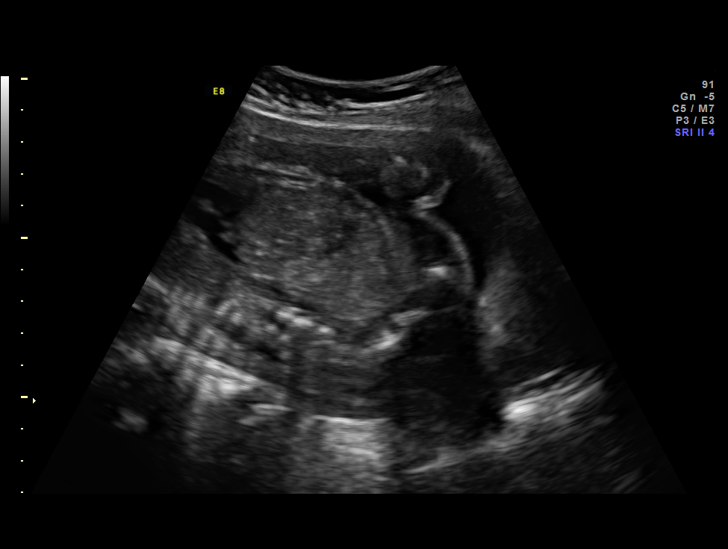
[im 3/22]
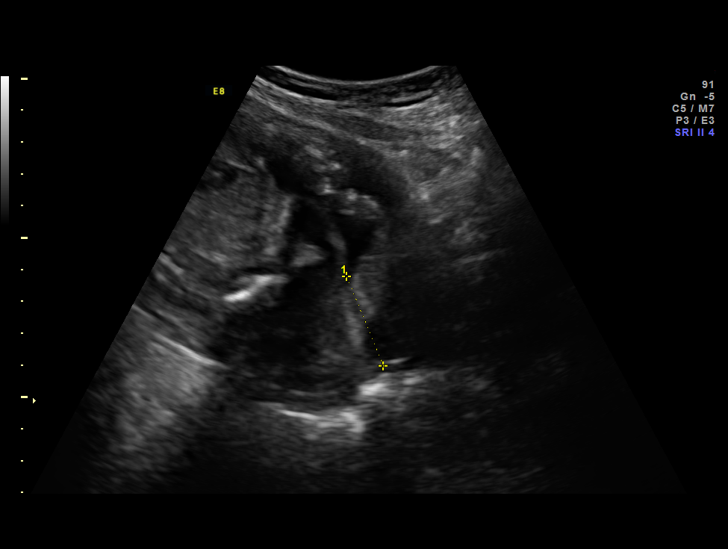
[im 4/22]
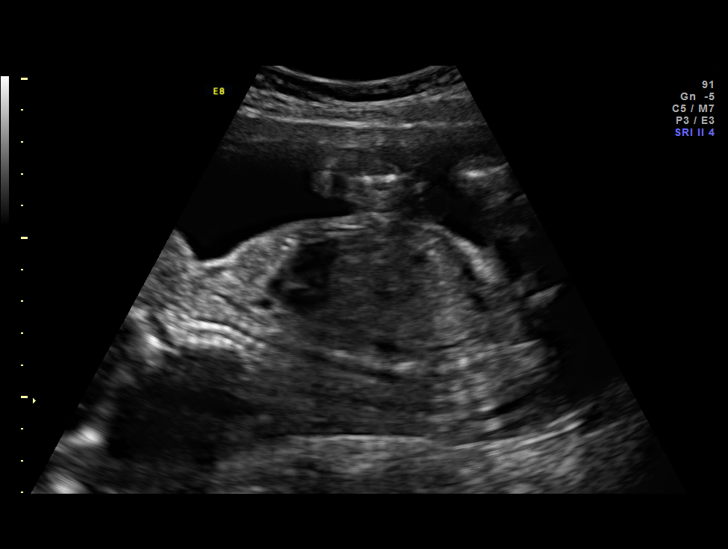
[im 6/22]
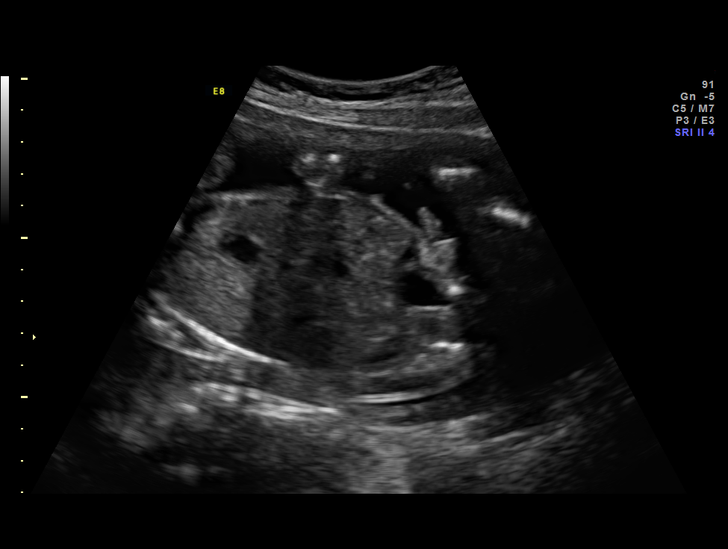
[im 8/22]
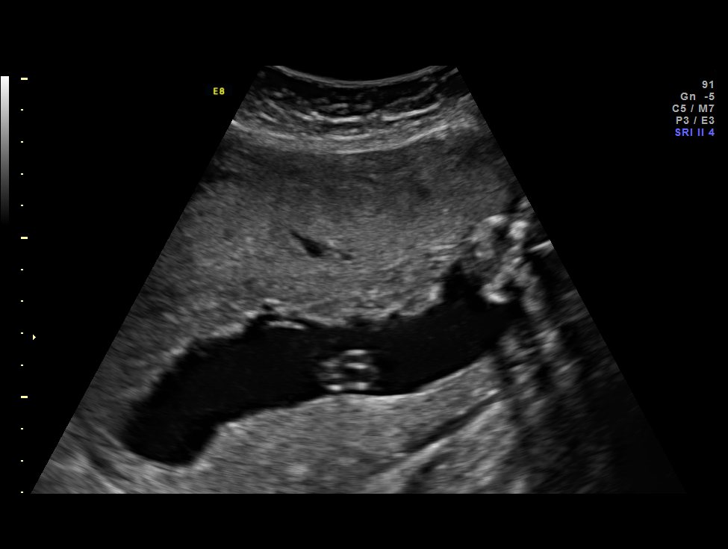
[im 9/22]
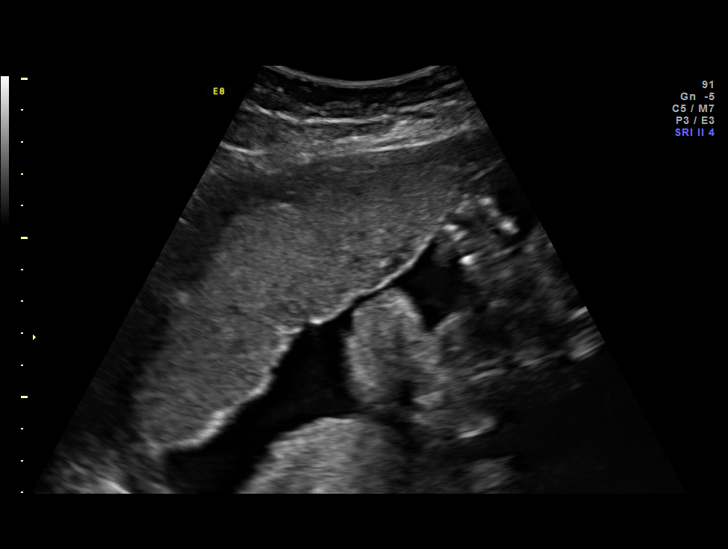
[im 11/22]
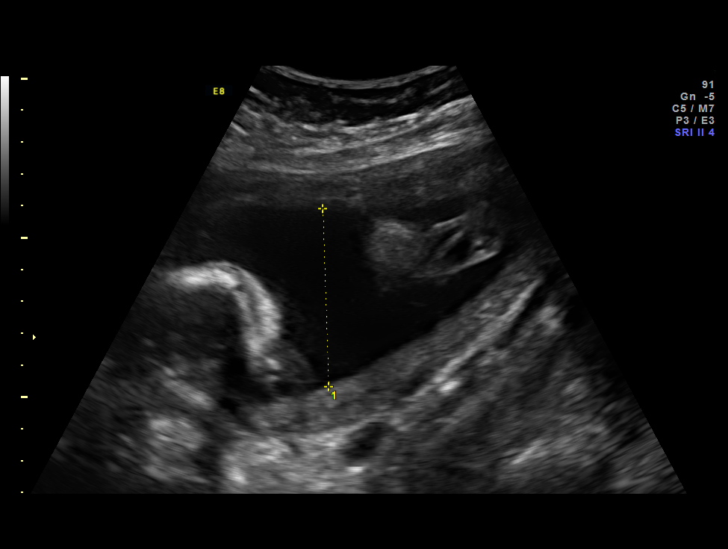
[im 12/22]
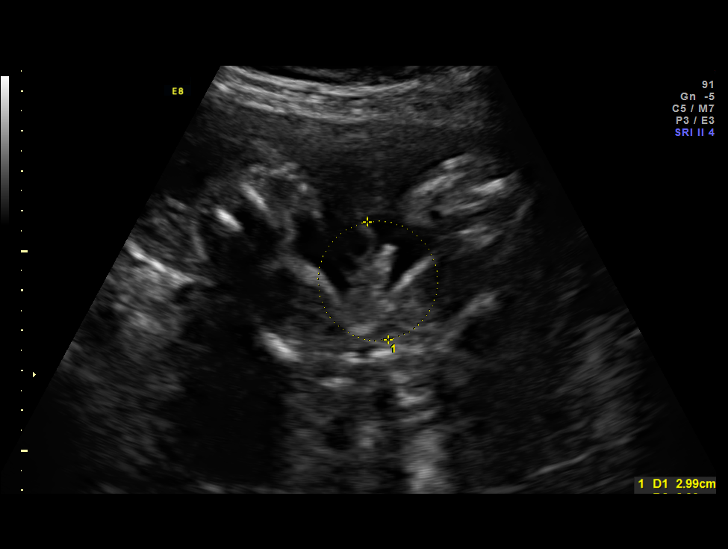
[im 14/22]
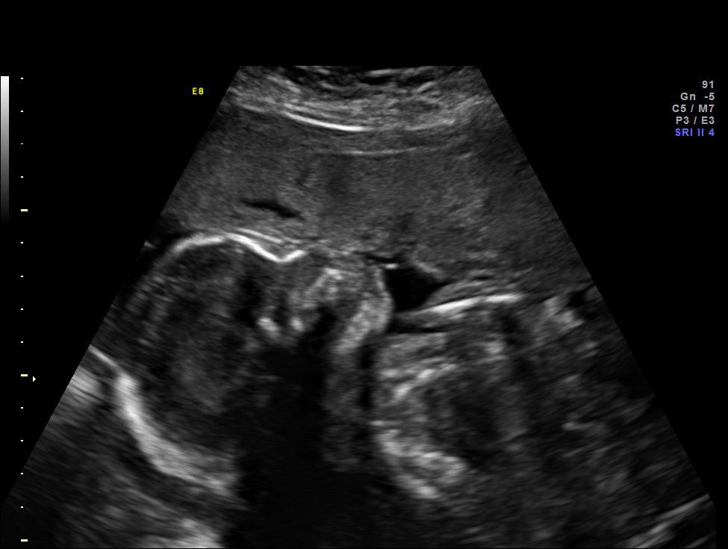
[im 15/22]
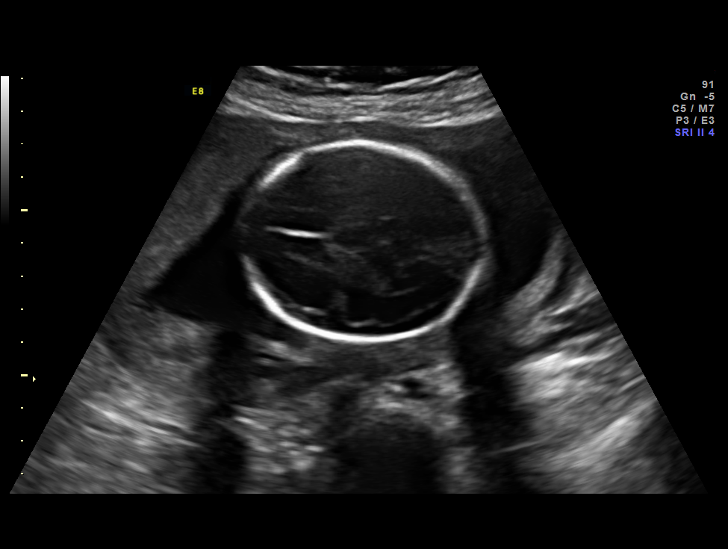
[im 17/22]
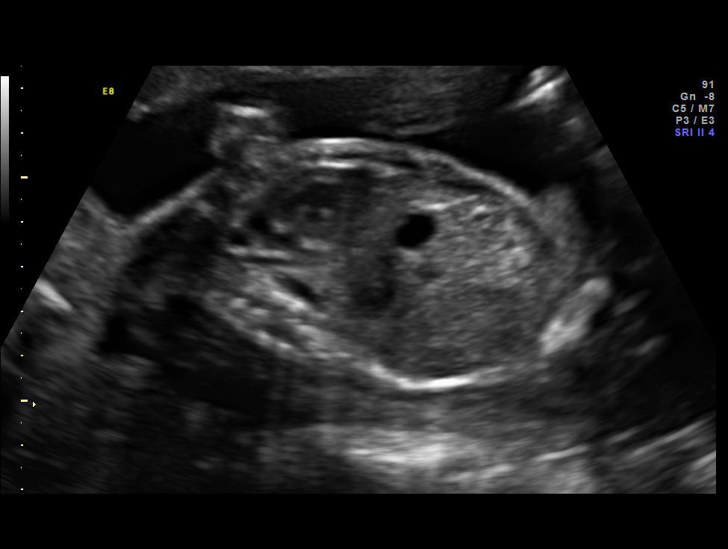
[im 19/22]
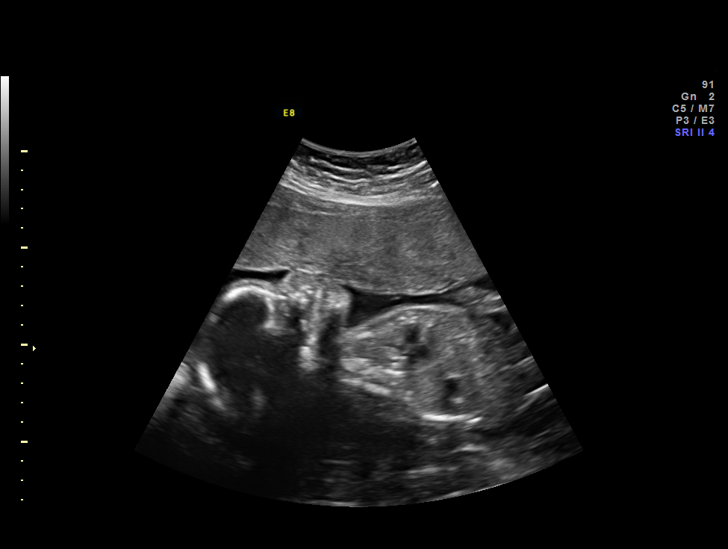
[im 20/22]
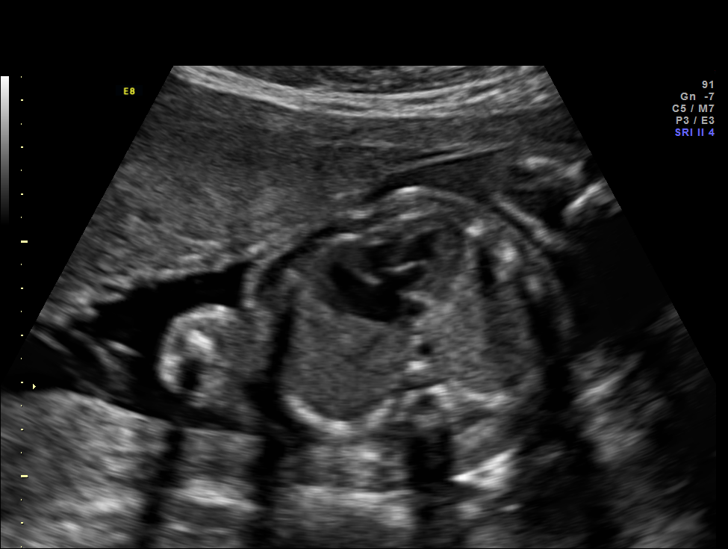
[im 22/22]
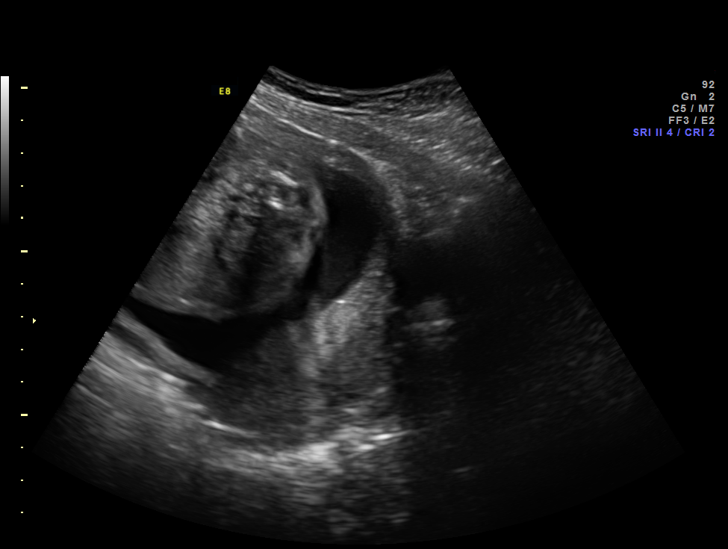

[14 of 22 positions shown; findings below may reference images not displayed]

Canned report from images found in remote index.

Refer to host system for actual result text.

## 2014-05-02 ENCOUNTER — Encounter: Payer: Self-pay | Admitting: Medical

## 2014-05-31 ENCOUNTER — Emergency Department (INDEPENDENT_AMBULATORY_CARE_PROVIDER_SITE_OTHER)
Admission: EM | Admit: 2014-05-31 | Discharge: 2014-05-31 | Disposition: A | Discharge status: Home / Self Care | Payer: Self-pay | Source: Home / Self Care | Attending: Family Medicine | Admitting: Family Medicine

## 2014-05-31 ENCOUNTER — Encounter (HOSPITAL_COMMUNITY): Payer: Self-pay | Admitting: Emergency Medicine

## 2014-05-31 DIAGNOSIS — J039 Acute tonsillitis, unspecified: Secondary | ICD-10-CM

## 2014-05-31 LAB — POCT RAPID STREP A: STREPTOCOCCUS, GROUP A SCREEN (DIRECT): NEGATIVE

## 2014-05-31 MED ORDER — ACETAMINOPHEN 325 MG PO TABS
975.0000 mg | ORAL_TABLET | Freq: Once | ORAL | Status: AC
  Administered 2014-05-31: 975 mg via ORAL

## 2014-05-31 MED ORDER — AMOXICILLIN 400 MG/5ML PO SUSR: 1000.0000 mg | Freq: Two times a day (BID) | ORAL | Status: AC

## 2014-05-31 MED ORDER — ACETAMINOPHEN 325 MG PO TABS
ORAL_TABLET | ORAL | Status: AC
  Filled 2014-05-31: qty 3

## 2014-05-31 NOTE — ED Notes (Signed)
C/o severe sore throat, pain with swallowing, and fever.  No otc meds taken for symptoms.  Denies n/v/d

## 2014-05-31 NOTE — ED Provider Notes (Signed)
Tina [name] is a 33 y.o. female who presents to Urgent Care today for sore throat left ear pain and fever. No vomiting diarrhea or cough. Pain is severe. Patient has not tried any medications yet. Symptoms present for about a day. No chest pain palpitations shortness of breath.   Past Medical History  Diagnosis Date  . Ovarian cyst   . Anemia   . Infection     UTI  . Abnormal Pap smear     f/u was ok  . Vaginal Pap smear, abnormal    Past Surgical History  Procedure Laterality Date  . No past surgeries    . Tubal ligation Bilateral 09/02/2013    Procedure: POST PARTUM TUBAL LIGATION;  Surgeon: Guss Bunde, MD;  Location: [facility];  Service: Gynecology;  Laterality: Bilateral;   History  Substance Use Topics  . Smoking status: Never Smoker   . Smokeless tobacco: Never Used  . Alcohol Use: No   ROS as above Medications: No current facility-administered medications for this encounter.   Current Outpatient Prescriptions  Medication Sig Dispense Refill  . amoxicillin (AMOXIL) 400 MG/5ML suspension Take 12.5 mLs (1,000 mg total) by mouth 2 (two) times daily. 10 days 300 mL 0  . Boric Acid CRYS 1 each by Does not apply route every 3 (three) days.    Marland Kitchen docusate sodium (COLACE) 100 MG capsule Take 1 capsule (100 mg total) by mouth every 12 (twelve) hours. 60 capsule 0  . ibuprofen (ADVIL,MOTRIN) 600 MG tablet Take 1 tablet (600 mg total) by mouth every 6 (six) hours. 30 tablet 0  . Prenatal Vit-Fe Fumarate-FA (PRENATAL MULTIVITAMIN) TABS tablet Take 1 tablet by mouth daily at 12 noon.     Allergies  Allergen Reactions  . Diflucan [Fluconazole] Anaphylaxis and Rash  . Ferrous Sulfate Anaphylaxis    Iv dose  . Monistat [Miconazole]     burning  . Tdap [Diphth-Acell Pertussis-Tetanus] Swelling    fever     Exam:  BP 130/85 mmHg  Pulse 99  Temp(Src) 101 F (38.3 C) (Oral)  Resp 12  SpO2 99%  LMP 05/01/2014  Breastfeeding? Yes Gen: Well NAD HEENT: EOMI,  MMM  posterior pharynx is erythematous. Normal tympanic membranes bilaterally. Patient has tender bilateral cervical lymphadenopathy present. Lungs: Normal work of breathing. CTABL Heart: RRR no MRG Abd: NABS, Soft. Nondistended, Nontender Exts: Brisk capillary refill, warm and well perfused.   Patient was given 975 mg of Tylenol, and felt a little better.  Results for orders placed or performed during the hospital encounter of 05/31/14 (from the past 24 hour(s))  POCT rapid strep A [facility])     Status: None   Collection Time: 05/31/14 12:24 PM  Result Value Ref Range   Streptococcus, Group A Screen (Direct) NEGATIVE NEGATIVE   No results found.  Assessment and Plan: 33 y.o. female with pharyngitis or tonsillitis. Rapid test negative. Culture pending. Treatment with amoxicillin as patient is quite symptomatic.  Discussed warning signs or symptoms. Please see discharge instructions. Patient expresses understanding.     Gregor Hams, MD 05/31/14 [phone]

## 2014-05-31 NOTE — Discharge Instructions (Signed)
Thank you for coming in today. Take Tylenol for pain Use amoxicillin twice daily for 10 days Follow-up with primary care provider Go to the emergency room if you get worse  Call or go to the emergency room if you get worse, have trouble breathing, have chest pains, or palpitations.    Pharyngitis Pharyngitis is redness, pain, and swelling (inflammation) of your pharynx.  CAUSES  Pharyngitis is usually caused by infection. Most of the time, these infections are from viruses (viral) and are part of a cold. However, sometimes pharyngitis is caused by bacteria (bacterial). Pharyngitis can also be caused by allergies. Viral pharyngitis may be spread from person to person by coughing, sneezing, and personal items or utensils (cups, forks, spoons, toothbrushes). Bacterial pharyngitis may be spread from person to person by more intimate contact, such as kissing.  SIGNS AND SYMPTOMS  Symptoms of pharyngitis include:   Sore throat.   Tiredness (fatigue).   Low-grade fever.   Headache.  Joint pain and muscle aches.  Skin rashes.  Swollen lymph nodes.  Plaque-like film on throat or tonsils (often seen with bacterial pharyngitis). DIAGNOSIS  Your health care provider will ask you questions about your illness and your symptoms. Your medical history, along with a physical exam, is often all that is needed to diagnose pharyngitis. Sometimes, a rapid strep test is done. Other lab tests may also be done, depending on the suspected cause.  TREATMENT  Viral pharyngitis will usually get better in 3-4 days without the use of medicine. Bacterial pharyngitis is treated with medicines that kill germs (antibiotics).  HOME CARE INSTRUCTIONS   Drink enough water and fluids to keep your urine clear or pale yellow.   Only take over-the-counter or prescription medicines as directed by your health care provider:   If you are prescribed antibiotics, make sure you finish them even if you start to feel  better.   Do not take aspirin.   Get lots of rest.   Gargle with 8 oz of salt water ( tsp of salt per 1 qt of water) as often as every 1-2 hours to soothe your throat.   Throat lozenges (if you are not at risk for choking) or sprays may be used to soothe your throat. SEEK MEDICAL CARE IF:   You have large, tender lumps in your neck.  You have a rash.  You cough up green, yellow-brown, or bloody spit. SEEK IMMEDIATE MEDICAL CARE IF:   Your neck becomes stiff.  You drool or are unable to swallow liquids.  You vomit or are unable to keep medicines or liquids down.  You have severe pain that does not go away with the use of recommended medicines.  You have trouble breathing (not caused by a stuffy nose). MAKE SURE YOU:   Understand these instructions.  Will watch your condition.  Will get help right away if you are not doing well or get worse. Document Released: 06/17/2005 Document Revised: 04/07/2013 Document Reviewed: 02/22/2013 [facility] Patient Information 2015 [place], [place]. This information is not intended to replace advice given to you by your health care provider. Make sure you discuss any questions you have with your health care provider.

## 2014-06-02 LAB — CULTURE, GROUP A STREP

## 2014-06-06 NOTE — ED Notes (Signed)
Patient called regarding lab reports

## 2014-06-27 IMAGING — US US OB FOLLOW-UP
1 series · 12 of 28 positions shown · non-contrast
Comparison: none

OBSTETRICS REPORT
                      (Signed Final 08/09/2013 [DATE])

Service(s) Provided
 US OB FOLLOW UP                                       76816.1
Indications
 Cardiac (heart) anomaly, unbalanced AV canal
 defect; normal karyotype and FISH for 66q33
Fetal Evaluation
 Num Of Fetuses:    1
 Fetal Heart Rate:  139                          bpm
 Cardiac Activity:  Observed
 Presentation:      Cephalic
 Placenta:          Anterior, above cervical os
 P. Cord            Previously Visualized
 Insertion:
 Amniotic Fluid
 AFI FV:      Subjectively within normal limits
 AFI Sum:     14.1    cm       51  %Tile     Larg Pckt:    5.75  cm
 RUQ:   5.75    cm   RLQ:    2.93   cm    LUQ:   2.99    cm   LLQ:    2.43   cm
Biometry
 BPD:     81.8  mm     G. Age:  32w 6d                CI:         78.7   70 - 86
 OFD:    103.9  mm                                    FL/HC:      21.6   20.1 -
 HC:     295.2  mm     G. Age:  32w 4d      < 3  %    HC/AC:      0.98   0.93 -
 AC:     301.4  mm     G. Age:  34w 1d       17  %    FL/BPD:     77.9   71 - 87
 FL:      63.7  mm     G. Age:  33w 0d      < 3  %    FL/AC:      21.1   20 - 24
 HUM:     56.4  mm     G. Age:  32w 6d       12  %
 Est. FW:    0027  gm    4 lb 14 oz      16  %
Gestational Age
 LMP:           35w 5d        Date:  12/02/12                 EDD:   09/08/13
 U/S Today:     33w 1d                                        EDD:   09/26/13
 Best:          35w 5d     Det. By:  LMP  (12/02/12)          EDD:   09/08/13
Anatomy
 Cranium:          Appears normal         Diaphragm:        Appears normal
 Fetal Cavum:      Appears normal         Stomach:          Appears normal, left
                                                            sided
 Ventricles:       Appears normal         Abdomen:          Appears normal
 Choroid Plexus:   Previously seen        Abdominal Wall:   Previously seen
 Cerebellum:       Previously seen        Cord Vessels:     Previously seen
 Posterior Fossa:  Previously seen        Kidneys:          Appear normal
 Nuchal Fold:      Not applicable (>20    Bladder:          Appears normal
                   wks GA)
 Face:             Orbits and profile     Spine:            Previously seen
                   previously seen
 Lips:             Previously seen        Lower             Previously seen
                                          Extremities:
 Heart:            AV canal defect        Upper             Previously seen
 Other:  Male gender previously visualized. Heels previously seen.
Cervix Uterus Adnexa
 Cervix:       Not visualized (advanced GA >83wks)
 Uterus:       No abnormality visualized.
 Cul De Sac:   No free fluid seen.
 Left Ovary:    Within normal limits.
 Right Ovary:   Within normal limits.
 Adnexa:     No abnormality visualized.
Impression
INDICATION: 32 yr old [id number] at 71w1d with fetus with AV
 canal defect for follow up ultrasound.

[Series 1: us ob follow-up · 0.23mm/px · 12 of 39 slices shown]
[im 2/39]
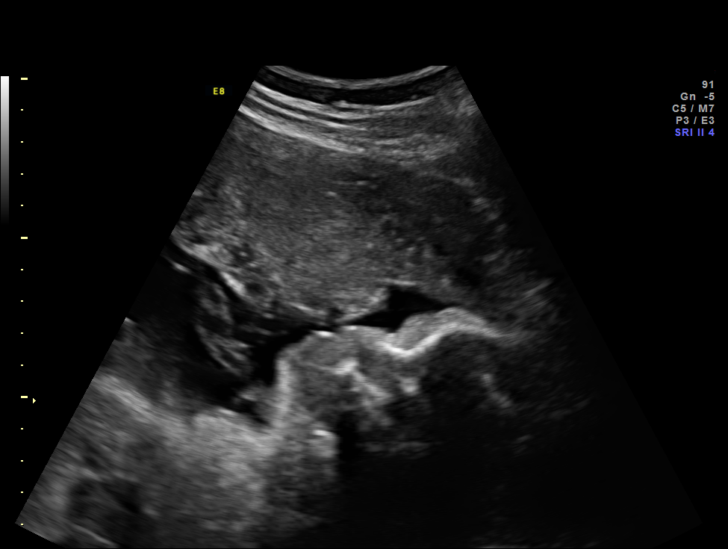
[im 5/39]
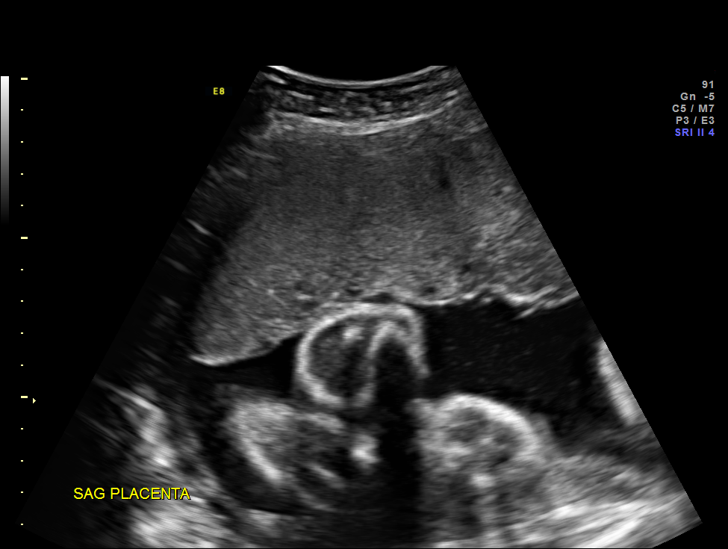
[im 8/39]
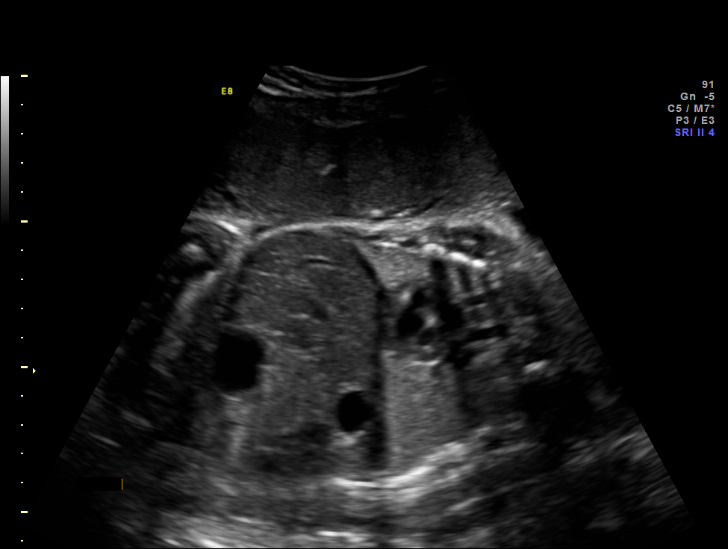
[im 12/39]
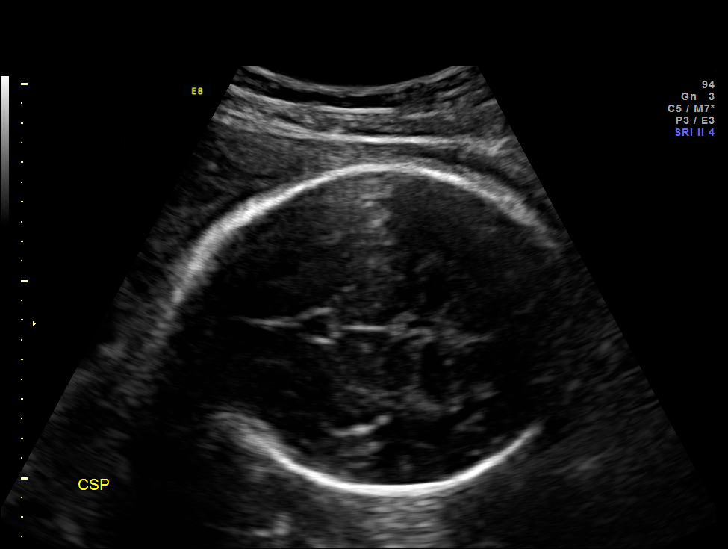
[im 15/39]
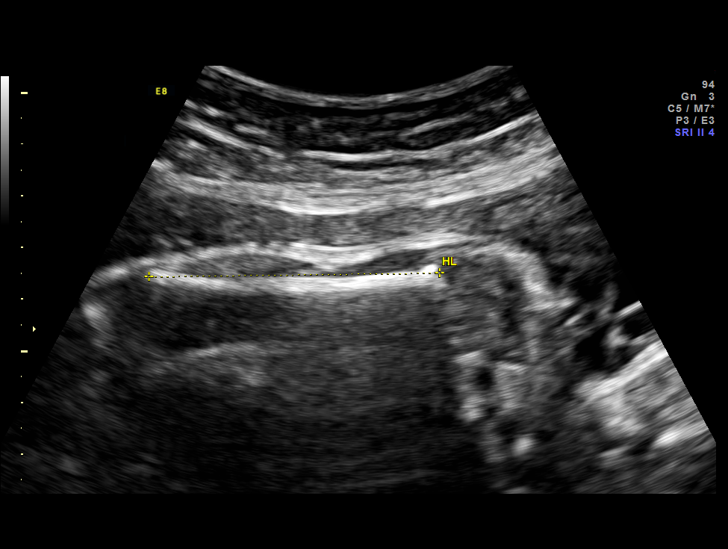
[im 17/39]
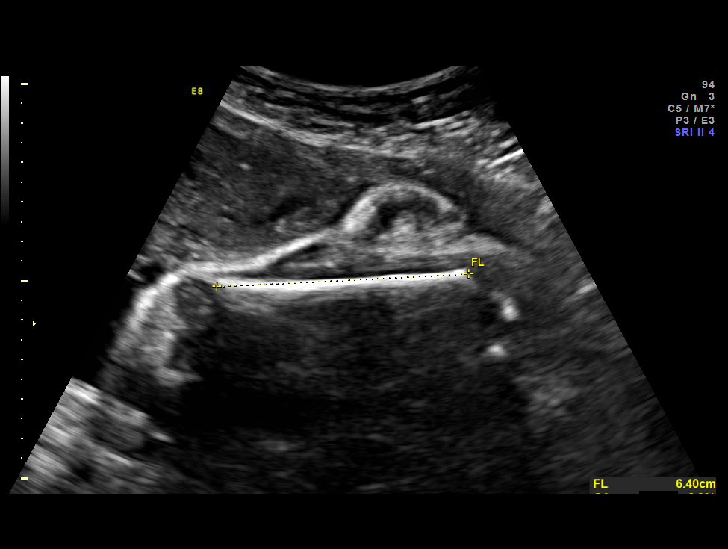
[im 22/39]
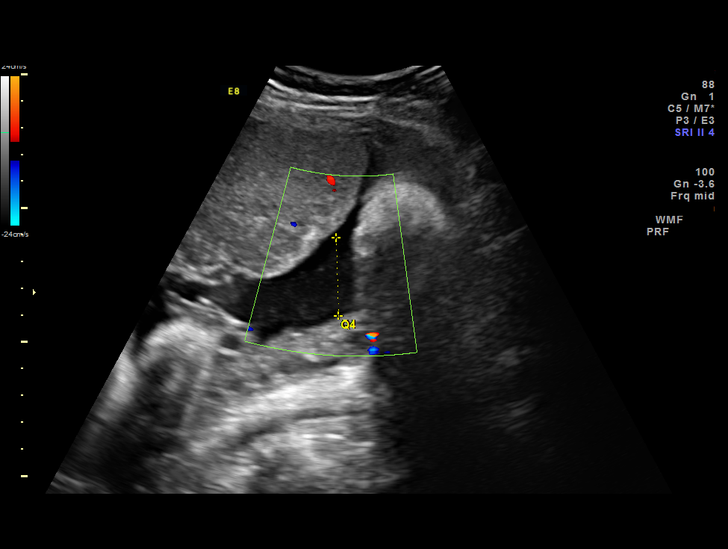
[im 24/39]
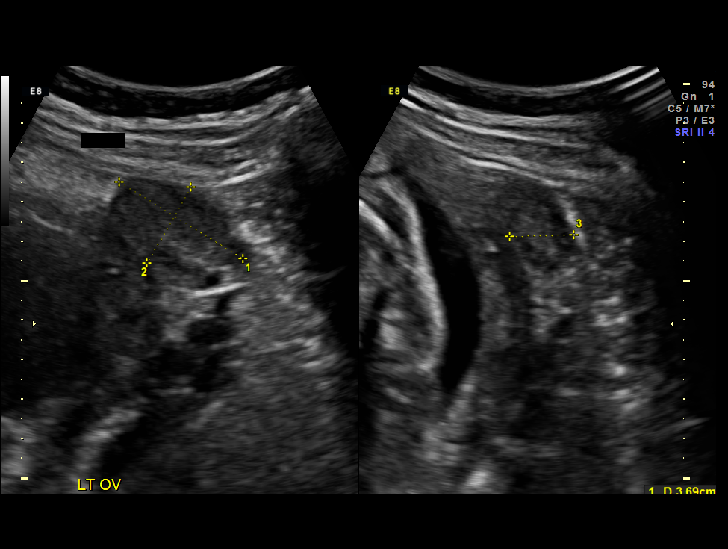
[im 27/39]
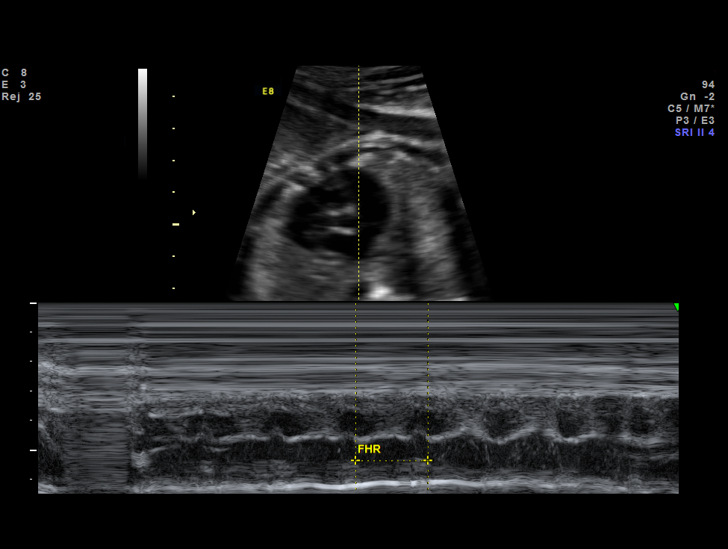
[im 31/39]
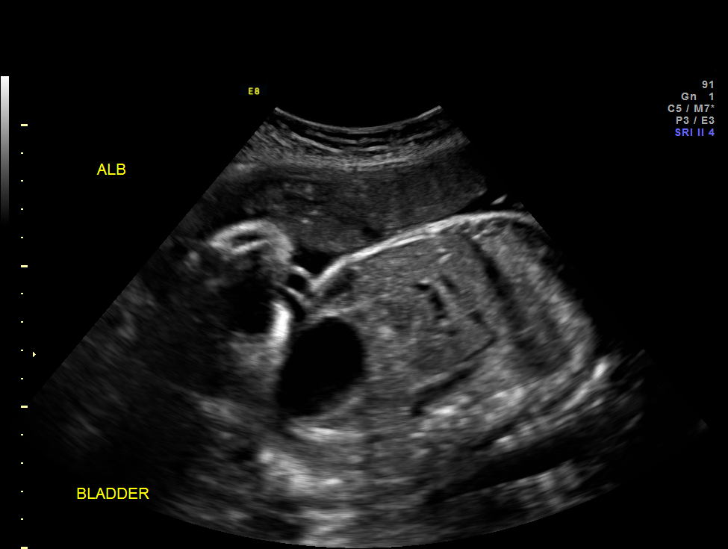
[im 34/39]
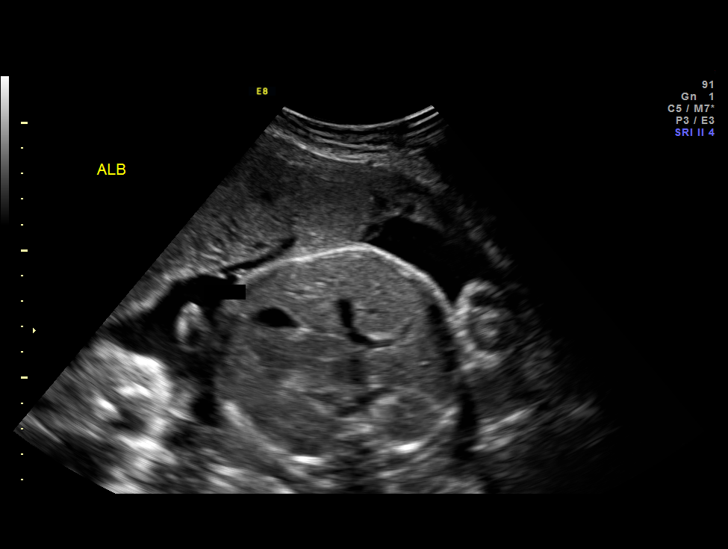
[im 37/39]
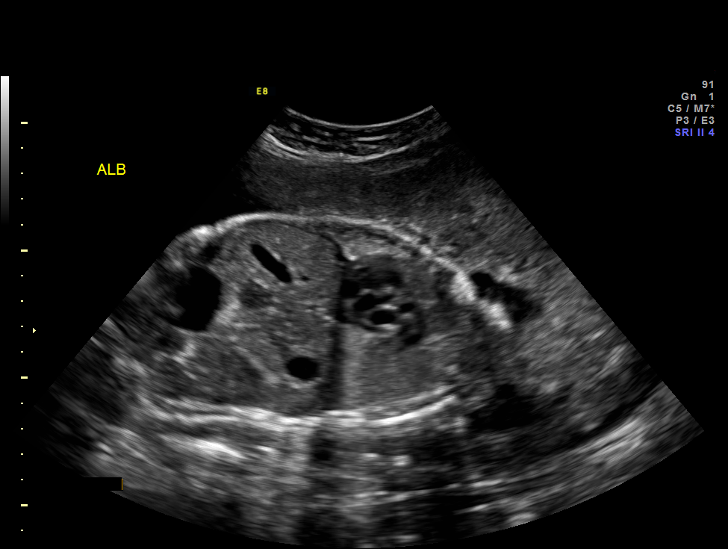

[12 of 28 positions shown; findings below may reference images not displayed]

FINDINGS: 1. Single intrauterine pregnancy.
 2. Estimated fetal weight is in the 16th%.
 3. Anterior placenta without evidence of previa.
 4. Normal amniotic fluid index.
 5. Again seen is an AV canal defect.
 6. The remainder of the limited anatomy survey is normal.
Recommendations

 1. Appropriate fetal growth
 - head measurements are somewhat lagging; however there
 has been appropriate interval growth and head
 measurements are not 2SD below the mean
 2. Fetal heart defect:
 - previously counseled
 - followed by Pediatric Cardiology
 - had normal fetal karyotype
 - recommend delivery at 39-40 weeks
 - recommend weekly evaluation of fetal doptones given
 previous episode of fetal bradycardia
 3. Recommend follow up ultrasound in 2 weeks

## 2015-08-14 ENCOUNTER — Telehealth: Payer: Self-pay | Admitting: Family Medicine

## 2015-08-14 NOTE — Telephone Encounter (Signed)
[facility] records rcd gave to [facility]

## 2015-08-18 ENCOUNTER — Encounter: Payer: Self-pay | Admitting: Family Medicine

## 2015-08-18 ENCOUNTER — Ambulatory Visit (INDEPENDENT_AMBULATORY_CARE_PROVIDER_SITE_OTHER): Payer: BC Managed Care – PPO | Admitting: Family Medicine

## 2015-08-18 VITALS — BP 110/70 | HR 64 | Wt 151.4 lb

## 2015-08-18 DIAGNOSIS — Z8249 Family history of ischemic heart disease and other diseases of the circulatory system: Secondary | ICD-10-CM | POA: Insufficient documentation

## 2015-08-18 DIAGNOSIS — Z7689 Persons encountering health services in other specified circumstances: Secondary | ICD-10-CM

## 2015-08-18 DIAGNOSIS — Z8742 Personal history of other diseases of the female genital tract: Secondary | ICD-10-CM | POA: Insufficient documentation

## 2015-08-18 DIAGNOSIS — Z7189 Other specified counseling: Secondary | ICD-10-CM

## 2015-08-18 NOTE — Progress Notes (Signed)
   Subjective:    Patient ID: Tina [name], female    DOB: 1981-05-10, 35 y.o.   MRN: [id number]  HPI Chief Complaint  Patient presents with  . new pt    new pt get est. no concerns   She is new to the practice and here to establish primary care. [facility] been [facility] past for care.  Last physical exam 04/2015 and fasting blood work. HIV negative Last pap smear: 04/2015 and normal per patient. 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  Other providers: dentist- Dr. Donn Pierini  Wears eyeglasses.   Past medical: 4 vaginal deliveries. Denies other significant PMH.  Surgeries: none Family: dad - hyperlipidemia, HTN. Grandparents- cancer  Lives with husband, 6 kids- 42 are her biological children.  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   Denies smoking, alcohol use,  Flu shot - refused Tdap- UTD but had allergic reaction  Review of Systems Pertinent positives and negatives in the history of present illness.     Objective:   Physical Exam BP 110/70 mmHg  Pulse 64  Wt 151 lb 6.4 oz (68.675 kg)  LMP 07/16/2015  Breastfeeding? No  Alert and oriented and in no acute distress. Not otherwise examined.      Assessment & Plan:  Paternal family history of hypertension  Encounter to establish care  History of abnormal cervical Pap smear  Discussed that she should continue taking good care of herself and keeping up with her health maintenance. Discussed guidelines for cervical cancer screening. She is up to date as of now on pap and blood work. She refuses flu shot.  Recommend that she eat healthy and make sure she is exercising. She has a family history of HTN and we will keep an eye on this in the future.  She appears to have a good support network and is married. Enjoys her job and overall has a good outlook on life.  Recommend that she return as needed or when due for her next physical.

## 2015-10-18 ENCOUNTER — Ambulatory Visit (INDEPENDENT_AMBULATORY_CARE_PROVIDER_SITE_OTHER): Payer: BC Managed Care – PPO | Admitting: Family Medicine

## 2015-10-18 ENCOUNTER — Encounter: Payer: Self-pay | Admitting: Family Medicine

## 2015-10-18 VITALS — BP 126/80 | HR 64 | Temp 98.3°F | Wt 156.2 lb

## 2015-10-18 DIAGNOSIS — M545 Low back pain, unspecified: Secondary | ICD-10-CM

## 2015-10-18 LAB — POCT URINALYSIS DIPSTICK
BILIRUBIN UA: NEGATIVE
Glucose, UA: NEGATIVE
Ketones, UA: NEGATIVE
LEUKOCYTES UA: NEGATIVE
Nitrite, UA: NEGATIVE
PH UA: 5.5
Protein, UA: NEGATIVE
RBC UA: NEGATIVE
Spec Grav, UA: 1.02
UROBILINOGEN UA: NEGATIVE

## 2015-10-18 NOTE — Patient Instructions (Addendum)
Your urine was negative. Try 2 aleve twice daily with food for next week. You can go for the XR tomorrow, the order is in the chart. Use heat 20 minutes at a time and do stretches.  Follow up if pain is not improving in next week or two.   Back Pain, Adult Back pain is very common in adults.The cause of back pain is rarely dangerous and the pain often gets better over time.The cause of your back pain may not be known. Some common causes of back pain include:  Strain of the muscles or ligaments supporting the spine.  Wear and tear (degeneration) of the spinal disks.  Arthritis.  Direct injury to the back. For many people, back pain may return. Since back pain is rarely dangerous, most people can learn to manage this condition on their own. HOME CARE INSTRUCTIONS Watch your back pain for any changes. The following actions may help to lessen any discomfort you are feeling:  Remain active. It is stressful on your back to sit or stand in one place for long periods of time. Do not sit, drive, or stand in one place for more than 30 minutes at a time. Take short walks on even surfaces as soon as you are able.Try to increase the length of time you walk each day.  Exercise regularly as directed by your health care provider. Exercise helps your back heal faster. It also helps avoid future injury by keeping your muscles strong and flexible.  Do not stay in bed.Resting more than 1-2 days can delay your recovery.  Pay attention to your body when you bend and lift. The most comfortable positions are those that put less stress on your recovering back. Always use proper lifting techniques, including:  Bending your knees.  Keeping the load close to your body.  Avoiding twisting.  Find a comfortable position to sleep. Use a firm mattress and lie on your side with your knees slightly bent. If you lie on your back, put a pillow under your knees.  Avoid feeling anxious or stressed.Stress increases  muscle tension and can worsen back pain.It is important to recognize when you are anxious or stressed and learn ways to manage it, such as with exercise.  Take medicines only as directed by your health care provider. Over-the-counter medicines to reduce pain and inflammation are often the most helpful.Your health care provider may prescribe muscle relaxant drugs.These medicines help dull your pain so you can more quickly return to your normal activities and healthy exercise.  Apply ice to the injured area:  Put ice in a plastic bag.  Place a towel between your skin and the bag.  Leave the ice on for 20 minutes, 2-3 times a day for the first 2-3 days. After that, ice and heat may be alternated to reduce pain and spasms.  Maintain a healthy weight. Excess weight puts extra stress on your back and makes it difficult to maintain good posture. SEEK MEDICAL CARE IF:  You have pain that is not relieved with rest or medicine.  You have increasing pain going down into the legs or buttocks.  You have pain that does not improve in one week.  You have night pain.  You lose weight.  You have a fever or chills. SEEK IMMEDIATE MEDICAL CARE IF:   You develop new bowel or bladder control problems.  You have unusual weakness or numbness in your arms or legs.  You develop nausea or vomiting.  You develop abdominal pain.  You feel faint.   This information is not intended to replace advice given to you by your health care provider. Make sure you discuss any questions you have with your health care provider.   Document Released: 06/17/2005 Document Revised: 07/08/2014 Document Reviewed: 10/19/2013 Elsevier Interactive Patient Education [employer].

## 2015-10-18 NOTE — Progress Notes (Signed)
   Subjective:    Patient ID: Tina [name], female    DOB: 09-19-1980, 35 y.o.   MRN: [id number]  HPI Chief Complaint  Patient presents with  . back pain    on monday, dancing and bending with her kids and feels like she pulled something   She is here with complaints of a 2 day history of right low back with occasional shooting pains around to right flank. Pain started after dancing with her children and picking up her baby, she felt a pop.  Pain is worse with certain movements.  She took Ibuprofen 800 mg with some relief.  She is adamant that she needs an XR of her low back.  States blood in urine last week, she was not menstruating. Denies history of kidney stone.  Denies fever, chills, numbness, tingling weakness. No radiation to buttocks or down leg.  Also denies urinary frequency, urgency, dysuria.   Tubal ligation in past.    Review of Systems Pertinent positives and negatives in the history of present illness.     Objective:   Physical Exam  Constitutional: She is oriented to person, place, and time. She appears well-developed and well-nourished. No distress.  Neck: Normal range of motion. Neck supple.  Cardiovascular: Normal rate, regular rhythm and normal heart sounds.   Pulmonary/Chest: Effort normal and breath sounds normal.  Abdominal: Soft. Bowel sounds are normal. She exhibits no distension. There is no tenderness. There is no rebound and no guarding.  Musculoskeletal:       Lumbar back: She exhibits pain. She exhibits normal range of motion, no tenderness, no bony tenderness, no edema and no spasm.       Back:  No erythema or bruising, nontender, pain with hyperextension right and left lateral bending and right twisting. Negative straight leg.   Neurological: She is alert and oriented to person, place, and time. She has normal strength and normal reflexes. Gait normal.   BP 126/80 mmHg  Pulse 64  Temp(Src) 98.3 F (36.8 C) (Oral)  Wt 156 lb 3.2 oz  (70.852 kg)    Urinalysis dipstick: negative     Assessment & Plan:  Right-sided low back pain without sciatica - Plan: POCT urinalysis dipstick, DG Lumbar Spine Complete  Discussed that her urine is negative and that her symptoms are most likely related to a musculoskeletal etiology. Will get an x-ray due to acute injury. Recommend taking 2 Aleve twice daily with food for the next week and let me know if she is not noticing any improvement. Also recommend using heat and doing stretches for her back that demonstrated in the office. Discussed that conservative therapy is appropriate for now and if she is not getting better we will proceed to physical therapy.

## 2015-10-19 ENCOUNTER — Ambulatory Visit
Admission: RE | Admit: 2015-10-19 | Discharge: 2015-10-19 | Disposition: A | Discharge status: Home / Self Care | Payer: BC Managed Care – PPO | Source: Ambulatory Visit | Attending: Family Medicine | Admitting: Family Medicine

## 2015-10-19 DIAGNOSIS — M545 Low back pain, unspecified: Secondary | ICD-10-CM

## 2016-01-03 ENCOUNTER — Encounter: Payer: Self-pay | Admitting: Family Medicine

## 2016-01-04 ENCOUNTER — Ambulatory Visit (INDEPENDENT_AMBULATORY_CARE_PROVIDER_SITE_OTHER): Payer: BC Managed Care – PPO | Admitting: Family Medicine

## 2016-01-04 ENCOUNTER — Encounter: Payer: Self-pay | Admitting: Family Medicine

## 2016-01-04 VITALS — BP 128/80 | HR 64 | Temp 98.1°F | Wt 150.8 lb

## 2016-01-04 DIAGNOSIS — L609 Nail disorder, unspecified: Secondary | ICD-10-CM

## 2016-01-04 DIAGNOSIS — L608 Other nail disorders: Secondary | ICD-10-CM

## 2016-01-04 NOTE — Progress Notes (Signed)
   Subjective:    Patient ID: Tina [name], female    DOB: Apr 16, 1981, 35 y.o.   MRN: [id number]  HPI Chief Complaint  Patient presents with  . toe fungus    toe fungus- about a month. dry skin on feet   She is here with complaints of her great toe nail on the right foot being discolored on the edge for the past 2 months. Reports going for pedicures regularly. Denies history of toenail fungus. Also reports having dry skin to sole of right foot and has been using Cetaphil. This has been an ongoing issue for her for years. Denies fever, chills, nausea, vomiting,  numbness, tingling, itching to foot.   Reviewed allergies, medications, past medical history.    Review of Systems Pertinent positives and negatives in the history of present illness.     Objective:   Physical Exam  Musculoskeletal:       Feet:  Right great toe nail with discoloration to lateral distal corner. Nail normal otherwise. Nail matrix not involved. Right foot exam normal otherwise.    BP 128/80 mmHg  Pulse 64  Temp(Src) 98.1 F (36.7 C) (Oral)  Wt 150 lb 12.8 oz (68.402 kg)      Assessment & Plan:  Toenail deformity  Discussed that the corner of her toenail is discolored but does not look like toenail fungus at this point. Recommend she do watchful waiting and if the area spreads then give [place] a call. Discussed treatment options for toenail fungus down the road if needed. Discussed using Eucerin or Aveeno or lotion that does not contain alcohol for dry skin to foot. She'll follow up as needed.

## 2016-01-24 ENCOUNTER — Encounter: Payer: Self-pay | Admitting: Family Medicine

## 2016-02-09 ENCOUNTER — Ambulatory Visit (INDEPENDENT_AMBULATORY_CARE_PROVIDER_SITE_OTHER): Payer: BC Managed Care – PPO | Admitting: Family Medicine

## 2016-02-09 ENCOUNTER — Encounter: Payer: Self-pay | Admitting: Family Medicine

## 2016-02-09 VITALS — BP 122/80 | HR 64 | Wt 152.8 lb

## 2016-02-09 DIAGNOSIS — S4992XA Unspecified injury of left shoulder and upper arm, initial encounter: Secondary | ICD-10-CM | POA: Diagnosis not present

## 2016-02-09 DIAGNOSIS — N926 Irregular menstruation, unspecified: Secondary | ICD-10-CM

## 2016-02-09 LAB — POCT URINE PREGNANCY: PREG TEST UR: NEGATIVE

## 2016-02-09 MED ORDER — NAPROXEN SODIUM 220 MG PO TABS: 220.0000 mg | ORAL_TABLET | Freq: Two times a day (BID) | ORAL | 0 refills | Status: DC

## 2016-02-09 NOTE — Progress Notes (Signed)
   Subjective:    Patient ID: Tina [name], female    DOB: 13-Apr-1981, 35 y.o.   MRN: [id number]  HPI Chief Complaint  Patient presents with  . Shoulder Pain    left shoulder pain x 2 days    She is here with complaints of left shoulder pain with movement for past 2 days. She states pain started when picking her 36 year old. Pain is improved with rest. She can lay on her left side without difficulty. Pain is nonradiating.  Has taken Ibuprofen 400 mg 1-2 times since injury but states she does not like to take medication. Denies locking or popping.  Denies fever, chills, numbness, tingling or weakness.   Also complains of no period for past 2 months. Had tubes tied in 2015 and reports periods have been irregular since then.    Review of Systems Pertinent positives and negatives in the history of present illness.     Objective:   Physical Exam BP 122/80 (BP Location: Right Arm, Patient Position: Sitting, Cuff Size: Normal)   Pulse 64   Wt 152 lb 12.8 oz (69.3 kg)   LMP 12/10/2015   BMI 28.87 kg/m   Alert and in no distress. Left upper extremity peripherovascularly intact. No crepitus to left shoulder, normal passive ROM, normal pulses, strength.  Negative lift off. Negative Neers and Hawkins. Supraspinatus testing is negative.  Urine pregnancy negative    Assessment & Plan:  Shoulder injury, left, initial encounter - Plan: naproxen sodium (ALEVE) 220 MG tablet  Missed period - Plan: POCT urine pregnancy  Discussed that her left shoulder joint is intact with full ROM and no x-ray needed at this point. Recommend conservative treatment such as 2 Aleve twice daily with food for next week. Aleve samples given #18. She can use heat and continue stretching and moving the joint to avoid worsening joint issues. She is also continuing to have intermittent back aches and suspect that this is related to picking up her 36 year old often. Recommend using good body mechanics and  demonstrated this for her.  She complains of ongoing irregular periods for past 2 years and has had her tubes tied, pregnancy test is negative.

## 2016-02-09 NOTE — Patient Instructions (Signed)
We will try conservative therapy and take 2 aleve twice daily with food for the next week. You can use heat to the joint and try to move it often to prevent worsening joint issues.  Use good body mechanics as discussed.  Let us know if you are not improving.

## 2016-02-10 ENCOUNTER — Encounter: Payer: Self-pay | Admitting: Family Medicine

## 2016-05-01 ENCOUNTER — Ambulatory Visit (INDEPENDENT_AMBULATORY_CARE_PROVIDER_SITE_OTHER): Payer: BC Managed Care – PPO | Admitting: Family Medicine

## 2016-05-01 ENCOUNTER — Ambulatory Visit
Admission: RE | Admit: 2016-05-01 | Discharge: 2016-05-01 | Disposition: A | Discharge status: Home / Self Care | Payer: BC Managed Care – PPO | Source: Ambulatory Visit | Attending: Family Medicine | Admitting: Family Medicine

## 2016-05-01 VITALS — BP 120/80 | HR 75 | Temp 98.4°F | Wt 156.4 lb

## 2016-05-01 DIAGNOSIS — S8992XA Unspecified injury of left lower leg, initial encounter: Secondary | ICD-10-CM

## 2016-05-01 DIAGNOSIS — J392 Other diseases of pharynx: Secondary | ICD-10-CM

## 2016-05-01 NOTE — Progress Notes (Signed)
   Subjective:    Patient ID: Tina [name], female    DOB: 1980-11-24, 35 y.o.   MRN: [id number]  HPI Chief Complaint  Patient presents with  . knee pain    knee pain- moving furniture and bumped on dresser at home on tuesday  . gag reflux    gag reflux been going on for a while   She is here with complaints of left anterior lower leg pain, below the knee, after hitting it on a dresser yesterday. Pain is a constant ache and tender and worse with walking and bending. Pain improves with elevating and resting. Ibuprofen and ice has helped the pain some.  Denies redness, swelling, locking and popping and giving of her knee. No numbness, tingling, weakness.    She also reports that she is concerned with her gag reflex and would like to know if there is anything she can do to decrease the sensitivity. States it has always been this way. She questions using a topical solution for numbing.   Denies fever, chills, nausea, vomiting, diarrhea.     Review of Systems Pertinent positives and negatives in the history of present illness.     Objective:   Physical Exam  Constitutional: She appears well-developed and well-nourished. No distress.  Musculoskeletal:       Left knee: She exhibits decreased range of motion. She exhibits no deformity, no erythema and normal patellar mobility.       Legs: Severe tenderness to palpation to anterior tibia. No obvious deformity. ROM to knee limited due to pain to tibia discomfort.  LLE is neurovascularly intact.  Antalgic gait.    BP 120/80   Pulse 75   Temp 98.4 F (36.9 C) (Oral)   Wt 156 lb 6.4 oz (70.9 kg)   BMI 29.55 kg/m       Assessment & Plan:  Injury of left lower leg, initial encounter - Plan: DG Tibia/Fibula Left  Hyperactive gag reflex  Patient requests an  x-ray of her left tibia. Discussed that she most likely does not have a fracture but XR ordered. Recommend conservative treatment with anti-inflammatories and RICE.    Discussed that I am not aware of anything that she can do to improve her gag reflex. I do not recommend using a numbing solution due to the possibility of injury or choking.

## 2016-05-01 NOTE — Patient Instructions (Signed)
I am sending you for an x-ray of your left lower leg to rule out fracture. Continue using ice to the area 2-3 times per day and elevating as needed for comfort. You may take 800 mg of ibuprofen 3 times per day with food or 2 Aleve twice daily with food for pain control.

## 2017-07-18 ENCOUNTER — Ambulatory Visit: Payer: BC Managed Care – PPO | Admitting: Family Medicine

## 2017-07-18 ENCOUNTER — Encounter: Payer: Self-pay | Admitting: Family Medicine

## 2017-07-18 VITALS — BP 110/76 | HR 91 | Temp 98.8°F | Wt 146.2 lb

## 2017-07-18 DIAGNOSIS — R509 Fever, unspecified: Secondary | ICD-10-CM

## 2017-07-18 DIAGNOSIS — J0300 Acute streptococcal tonsillitis, unspecified: Secondary | ICD-10-CM | POA: Diagnosis not present

## 2017-07-18 LAB — POCT RAPID STREP A (OFFICE): RAPID STREP A SCREEN: POSITIVE — AB

## 2017-07-18 LAB — POCT INFLUENZA A/B
Influenza A, POC: NEGATIVE
Influenza B, POC: NEGATIVE

## 2017-07-18 MED ORDER — AMOXICILLIN 875 MG PO TABS: 875.0000 mg | ORAL_TABLET | Freq: Two times a day (BID) | ORAL | 0 refills | Status: DC

## 2017-07-18 NOTE — Progress Notes (Signed)
   Subjective:    Patient ID: Tina [name], female    DOB: July 09, 1980, 37 y.o.   MRN: [id number]  HPI She has a 1 day history of headache, sore throat, loose stool, abdominal discomfort, chills with myalgias and malaise.  The throat is hurting more on the left side.   Review of Systems     Objective:   Physical Exam Alert and in no distress. Tympanic membranes and canals are normal. Pharyngeal area is normal.  Both tonsils are swollen with exudate, left greater than right neck is supple with anterior cervical adenopathy no thyromegaly. Cardiac exam shows a regular sinus rhythm without murmurs or gallops. Lungs are clear to auscultation. Flu test is negative Strep test is positive       Assessment & Plan:  Fever and chills - Plan: Influenza A/B  Strep tonsillitis - Plan: Rapid Strep A, amoxicillin (AMOXIL) 875 MG tablet  She will call if further difficulty.  Recommend 2 Aleve twice per day

## 2017-07-31 NOTE — Progress Notes (Signed)
Subjective:    Patient ID: Tina [name], female    DOB: 1981-04-26, 37 y.o.   MRN: [id number]  HPI Chief Complaint  Patient presents with  . cpe    fasting cpe with pap   She is here for a complete physical exam.  Other providers: Dr. Donn Pierini  Social history: Lives with 6 kids ages 16-18 and spouse, works as a Pharmacist, hospital Diet: healthy, doing more vegan diet and intermittent fasting.  Excerise: walks daily   Immunizations: Tdap- in 2011. Thinks she had a bad reaction to this.   Health maintenance:  Mammogram: never  Colonoscopy: never  Last  Pap Smear: 2016? [facility] Requested STD testing.  Last Menstrual cycle: finishing cycle now. Irregular every 2 months.  Contraception: tubal ligation  Last Dental Exam: twice annually  Last Eye Exam: once annually   Wears seatbelt always, uses sunscreen, smoke detectors in home and functioning, does not text while driving and feels safe in home environment.   Reviewed allergies, medications, past medical, surgical, family, and social history.     Review of Systems Review of Systems Constitutional: -fever, -chills, -sweats, -unexpected weight change,-fatigue ENT: -runny nose, -ear pain, -sore throat Cardiology:  -chest pain, -palpitations, -edema Respiratory: -cough, -shortness of breath, -wheezing Gastroenterology: -abdominal pain, -nausea, -vomiting, -diarrhea, -constipation  Hematology: -bleeding or bruising problems Musculoskeletal: -arthralgias, -myalgias, -joint swelling, -back pain Ophthalmology: -vision changes Urology: -dysuria, -difficulty urinating, -hematuria, -urinary frequency, -urgency Neurology: -headache, -weakness, -tingling, -numbness       Objective:   Physical Exam BP 120/80   Pulse 68   Ht 5\' 2"  (1.575 m)   Wt 145 lb 9.6 oz (66 kg)   BMI 26.63 kg/m   General Appearance:    Alert, cooperative, no distress, appears stated age  Head:    Normocephalic, without obvious abnormality,  atraumatic  Eyes:    PERRL, conjunctiva/corneas clear, EOM's intact, fundi    benign  Ears:    Normal TM's and external ear canals  Nose:   Nares normal, mucosa normal, no drainage or sinus   tenderness  Throat:   Lips, mucosa, and tongue normal; teeth and gums normal  Neck:   Supple, no lymphadenopathy;  thyroid:  no   enlargement/tenderness/nodules; no carotid   bruit or JVD  Back:    Spine nontender, no curvature, ROM normal, no CVA     tenderness  Lungs:     Clear to auscultation bilaterally without wheezes, rales or     ronchi; respirations unlabored  Chest Wall:    No tenderness or deformity   Heart:    Regular rate and rhythm, S1 and S2 normal, no murmur, rub   or gallop  Breast Exam:    No tenderness, masses, or nipple discharge or inversion.      No axillary lymphadenopathy  Abdomen:     Soft, non-tender, nondistended, normoactive bowel sounds,    no masses, no hepatosplenomegaly  Genitalia:    Normal external genitalia without lesions.  BUS and vagina normal; cervix without lesions, or cervical motion tenderness. No abnormal vaginal discharge.  Uterus and adnexa not enlarged, nontender, no masses.  Pap performed. Chaperone present.   Rectal:    Not performed due to age<40 and no related complaints  Extremities:   No clubbing, cyanosis or edema  Pulses:   2+ and symmetric all extremities  Skin:   Skin color, texture, turgor normal, no rashes or lesions  Lymph nodes:   Cervical, supraclavicular, and axillary nodes normal  Neurologic:   CNII-XII intact, normal strength, sensation and gait; reflexes 2+ and symmetric throughout          Psych:   Normal mood, affect, hygiene and grooming.     Urinalysis dipstick: trace blood (menses), negative otherwise      Assessment & Plan:  Routine general medical examination at a health care facility - Plan: CBC with Differential/Platelet, Comprehensive metabolic panel, TSH, Lipid panel, POCT Urinalysis DIP (Proadvantage Device)  History of  abnormal cervical Pap smear - Plan: Cytology - PAP  Screening for cervical cancer - Plan: Cytology - PAP  Screen for STD (sexually transmitted disease) - Plan: RPR, HIV antibody  She appears to be doing well overall.  Discussed safety and health promotion.  Counseled on healthy lifestyle. She has been eating healthy and losing weight and is happy about this. She has a goal to lose 10-15 more lbs.  Pap smear done and chaperone present.  STD testing done.  Questionable early ingrown toenail without infection on right great toe. She showed me this on the way out the door. She may want to see a podiatrist for this. She has been getting pedicures for the past year.  Follow up pending labs.

## 2017-08-01 ENCOUNTER — Ambulatory Visit (INDEPENDENT_AMBULATORY_CARE_PROVIDER_SITE_OTHER): Payer: BC Managed Care – PPO | Admitting: Family Medicine

## 2017-08-01 ENCOUNTER — Encounter: Payer: Self-pay | Admitting: Family Medicine

## 2017-08-01 ENCOUNTER — Other Ambulatory Visit (HOSPITAL_COMMUNITY)
Admission: RE | Admit: 2017-08-01 | Discharge: 2017-08-01 | Disposition: A | Discharge status: Home / Self Care | Payer: BC Managed Care – PPO | Source: Ambulatory Visit | Attending: Family Medicine | Admitting: Family Medicine

## 2017-08-01 VITALS — BP 120/80 | HR 68 | Ht 62.0 in | Wt 145.6 lb

## 2017-08-01 DIAGNOSIS — Z1151 Encounter for screening for human papillomavirus (HPV): Secondary | ICD-10-CM | POA: Insufficient documentation

## 2017-08-01 DIAGNOSIS — Z87898 Personal history of other specified conditions: Secondary | ICD-10-CM

## 2017-08-01 DIAGNOSIS — Z0000 Encounter for general adult medical examination without abnormal findings: Secondary | ICD-10-CM

## 2017-08-01 DIAGNOSIS — Z124 Encounter for screening for malignant neoplasm of cervix: Secondary | ICD-10-CM

## 2017-08-01 DIAGNOSIS — Z113 Encounter for screening for infections with a predominantly sexual mode of transmission: Secondary | ICD-10-CM | POA: Diagnosis not present

## 2017-08-01 DIAGNOSIS — Z8742 Personal history of other diseases of the female genital tract: Secondary | ICD-10-CM

## 2017-08-01 LAB — POCT URINALYSIS DIP (PROADVANTAGE DEVICE)
Bilirubin, UA: NEGATIVE
GLUCOSE UA: NEGATIVE mg/dL
Ketones, POC UA: NEGATIVE mg/dL
LEUKOCYTES UA: NEGATIVE
NITRITE UA: NEGATIVE
Protein Ur, POC: NEGATIVE mg/dL
Specific Gravity, Urine: 1.03
Urobilinogen, Ur: NEGATIVE
pH, UA: 6 (ref 5.0–8.0)

## 2017-08-01 NOTE — Patient Instructions (Signed)

## 2017-08-02 LAB — COMPREHENSIVE METABOLIC PANEL
A/G RATIO: 1.2 (ref 1.2–2.2)
ALBUMIN: 4 g/dL (ref 3.5–5.5)
ALT: 11 IU/L (ref 0–32)
AST: 17 IU/L (ref 0–40)
Alkaline Phosphatase: 45 IU/L (ref 39–117)
BUN / CREAT RATIO: 10 (ref 9–23)
BUN: 6 mg/dL (ref 6–20)
CALCIUM: 9.3 mg/dL (ref 8.7–10.2)
CHLORIDE: 105 mmol/L (ref 96–106)
CO2: 21 mmol/L (ref 20–29)
Creatinine, Ser: 0.63 mg/dL (ref 0.57–1.00)
GFR, EST AFRICAN AMERICAN: 133 mL/min/{1.73_m2} (ref >59)
GFR, EST NON AFRICAN AMERICAN: 115 mL/min/{1.73_m2} (ref >59)
GLOBULIN, TOTAL: 3.3 g/dL (ref 1.5–4.5)
Glucose: 86 mg/dL (ref 65–99)
POTASSIUM: 4.1 mmol/L (ref 3.5–5.2)
Sodium: 141 mmol/L (ref 134–144)
Total Protein: 7.3 g/dL (ref 6.0–8.5)

## 2017-08-02 LAB — CBC WITH DIFFERENTIAL/PLATELET
BASOS: 0 %
Basophils Absolute: 0 10*3/uL (ref 0.0–0.2)
EOS (ABSOLUTE): 0 10*3/uL (ref 0.0–0.4)
EOS: 0 %
HEMATOCRIT: 28.1 % — AB (ref 34.0–46.6)
HEMOGLOBIN: 8.4 g/dL — AB (ref 11.1–15.9)
IMMATURE GRANS (ABS): 0 10*3/uL (ref 0.0–0.1)
Immature Granulocytes: 0 %
LYMPHS: 45 %
Lymphocytes Absolute: 2.2 10*3/uL (ref 0.7–3.1)
MCH: 20.9 pg — AB (ref 26.6–33.0)
MCHC: 29.9 g/dL — ABNORMAL LOW (ref 31.5–35.7)
MCV: 70 fL — AB (ref 79–97)
MONOCYTES: 5 %
Monocytes Absolute: 0.3 10*3/uL (ref 0.1–0.9)
NEUTROS ABS: 2.4 10*3/uL (ref 1.4–7.0)
Neutrophils: 50 %
Platelets: 363 10*3/uL (ref 150–379)
RBC: 4.01 x10E6/uL (ref 3.77–5.28)
RDW: 19.2 % — ABNORMAL HIGH (ref 12.3–15.4)
WBC: 4.9 10*3/uL (ref 3.4–10.8)

## 2017-08-02 LAB — TSH: TSH: 0.709 u[IU]/mL (ref 0.450–4.500)

## 2017-08-02 LAB — LIPID PANEL
CHOL/HDL RATIO: 4.7 ratio — AB (ref 0.0–4.4)
Cholesterol, Total: 189 mg/dL (ref 100–199)
HDL: 40 mg/dL (ref >39)
LDL CALC: 126 mg/dL — AB (ref 0–99)
Triglycerides: 116 mg/dL (ref 0–149)
VLDL Cholesterol Cal: 23 mg/dL (ref 5–40)

## 2017-08-02 LAB — RPR: RPR Ser Ql: NONREACTIVE

## 2017-08-02 LAB — HIV ANTIBODY (ROUTINE TESTING W REFLEX): HIV SCREEN 4TH GENERATION: NONREACTIVE

## 2017-08-04 ENCOUNTER — Encounter: Payer: Self-pay | Admitting: Family Medicine

## 2017-08-04 NOTE — Telephone Encounter (Signed)
Spoke to patient.  Pt Is not taking any iron pill, has had a history of this but never been worked up for this. Menstrual cycles are heavy. Spoke to Onstott [employer]ifin- NP-C and she recommends to start taking a daily iron and recheck in 6-8 weeks. Pt was notified

## 2017-08-05 LAB — CYTOLOGY - PAP
CHLAMYDIA, DNA PROBE: NEGATIVE
DIAGNOSIS: NEGATIVE
HPV: NOT DETECTED
NEISSERIA GONORRHEA: NEGATIVE
TRICH (WINDOWPATH): NEGATIVE

## 2018-08-03 ENCOUNTER — Ambulatory Visit (INDEPENDENT_AMBULATORY_CARE_PROVIDER_SITE_OTHER): Payer: BC Managed Care – PPO | Admitting: Family Medicine

## 2018-08-03 ENCOUNTER — Encounter: Payer: Self-pay | Admitting: Family Medicine

## 2018-08-03 VITALS — BP 120/80 | HR 70 | Ht 61.75 in | Wt 158.2 lb

## 2018-08-03 DIAGNOSIS — D509 Iron deficiency anemia, unspecified: Secondary | ICD-10-CM

## 2018-08-03 DIAGNOSIS — Z113 Encounter for screening for infections with a predominantly sexual mode of transmission: Secondary | ICD-10-CM

## 2018-08-03 DIAGNOSIS — Z0000 Encounter for general adult medical examination without abnormal findings: Secondary | ICD-10-CM | POA: Diagnosis not present

## 2018-08-03 DIAGNOSIS — E7800 Pure hypercholesterolemia, unspecified: Secondary | ICD-10-CM | POA: Diagnosis not present

## 2018-08-03 DIAGNOSIS — L600 Ingrowing nail: Secondary | ICD-10-CM

## 2018-08-03 LAB — POCT URINALYSIS DIP (PROADVANTAGE DEVICE)
BILIRUBIN UA: NEGATIVE mg/dL
Bilirubin, UA: NEGATIVE
Blood, UA: NEGATIVE
Glucose, UA: NEGATIVE mg/dL
LEUKOCYTES UA: NEGATIVE
NITRITE UA: NEGATIVE
PROTEIN UA: NEGATIVE mg/dL
Specific Gravity, Urine: 1.03
Urobilinogen, Ur: NEGATIVE
pH, UA: 5.5 (ref 5.0–8.0)

## 2018-08-03 NOTE — Progress Notes (Signed)
Subjective:    Patient ID: Tina [name], female    DOB: [birth year], 38 y.o.   MRN: [id number]  HPI Chief Complaint  Patient presents with  . cpe    fasting cpe, bump on right arm- painful, toe still hurts, pap done 2019- normal   She is here for a complete physical exam. Last CPE: 08/01/2017   Other providers:  Dr. Donn Pierini - dentist  Dr. Gershon Crane- eye doctor   Complains of a one year history of right great toe pain at the nail.   States she has a bump in her right axilla that is intermittently tender.   Anemia in the past and states she is not taking iron.  Denies fatigue, dizziness, shortness of breath.   Social history: Lives with husband and has 6 kids.  works in the school system.  Denies smoking, drinking alcohol, drug use  Diet: unhealthy  Excerise: nothing   Immunizations: Tdap 2011. Gardasil received per patient. Flu shot- refuses   Health maintenance:  Mammogram: never  Colonoscopy: never  Last Gynecological Exam: pap smear normal 08/2017 and HPV negative  Last Menstrual cycle: 06/02/2018 Contraception- tubal ligation  Requests STD testing.   Wears seatbelt always, smoke detectors in home and functioning, does not text while driving and feels safe in home environment.   Reviewed allergies, medications, past medical, surgical, family, and social history.   Review of Systems Review of Systems Constitutional: -fever, -chills, -sweats, -unexpected weight change,-fatigue ENT: -runny nose, -ear pain, -sore throat Cardiology:  -chest pain, -palpitations, -edema Respiratory: -cough, -shortness of breath, -wheezing Gastroenterology: -abdominal pain, -nausea, -vomiting, -diarrhea, -constipation  Hematology: -bleeding or bruising problems Musculoskeletal: -arthralgias, -myalgias, -joint swelling, -back pain Ophthalmology: -vision changes Urology: -dysuria, -difficulty urinating, -hematuria, -urinary frequency, -urgency Neurology: -headache, -weakness,  -tingling, -numbness       Objective:   Physical Exam BP 120/80   Pulse 70   Ht 5' 1.75" (1.568 m)   Wt 158 lb 3.2 oz (71.8 kg)   LMP 07/01/2018   BMI 29.17 kg/m   General Appearance:    Alert, cooperative, no distress, appears stated age  Head:    Normocephalic, without obvious abnormality, atraumatic  Eyes:    PERRL, conjunctiva/corneas clear, EOM's intact, fundi    benign  Ears:    Normal TM's and external ear canals  Nose:   Nares normal, mucosa normal, no drainage or sinus   tenderness  Throat:   Lips, mucosa, and tongue normal; teeth and gums normal  Neck:   Supple, no lymphadenopathy;  thyroid:  no   enlargement/tenderness/nodules; no carotid   bruit or JVD  Back:    Spine nontender, no curvature, ROM normal, no CVA     tenderness  Lungs:     Clear to auscultation bilaterally without wheezes, rales or     ronchi; respirations unlabored  Chest Wall:    No tenderness or deformity   Heart:    Regular rate and rhythm, S1 and S2 normal, no murmur, rub   or gallop  Breast Exam:    No tenderness, masses, or nipple discharge or inversion.      No axillary lymphadenopathy. Pea-sized firm area in right axilla without erythema, fluctuance, non tender  Abdomen:     Soft, non-tender, nondistended, normoactive bowel sounds,    no masses, no hepatosplenomegaly  Genitalia:    Declines. Pap smear UTD  Rectal:    Not performed due to age<40 and no related complaints  Extremities:   No clubbing,  cyanosis or edema  Pulses:   2+ and symmetric all extremities  Skin:   Skin color, texture, turgor normal, no rashes or lesions  Lymph nodes:   Cervical, supraclavicular, and axillary nodes normal  Neurologic:   CNII-XII intact, normal strength, sensation and gait; reflexes 2+ and symmetric throughout          Psych:   Normal mood, affect, hygiene and grooming.     Urinalysis dipstick: negative       Assessment & Plan:  Routine general medical examination at a health care facility - Plan:  POCT Urinalysis DIP (Proadvantage Device), CBC with Differential/Platelet, Comprehensive metabolic panel, TSH, T4, free, Lipid panel  Iron deficiency anemia, unspecified iron deficiency anemia type - Plan: Iron, TIBC and Ferritin Panel  Ingrown nail of great toe of right foot - Plan: Ambulatory referral to Podiatry  Elevated LDL cholesterol level - Plan: Lipid panel  Screen for STD (sexually transmitted disease) - Plan: RPR, HIV Antibody (routine testing w rflx), GC/Chlamydia Probe Amp  She is here today for CPE.   History of anemia and is not currently taking iron.  Recheck CBC and iron studies Elevated LDL-check fasting lipids Ingrown nail-referral to podiatry Counseling on healthy diet and exercise. Immunizations discussed. STD screening done per patient request Follow-up pending labs

## 2018-08-03 NOTE — Patient Instructions (Signed)
Preventive Care 18-39 Years, Female Preventive care refers to lifestyle choices and visits with your health care provider that can promote health and wellness. What does preventive care include?   A yearly physical exam. This is also called an annual well check.  Dental exams once or twice a year.  Routine eye exams. Ask your health care provider how often you should have your eyes checked.  Personal lifestyle choices, including: ? Daily care of your teeth and gums. ? Regular physical activity. ? Eating a healthy diet. ? Avoiding tobacco and drug use. ? Limiting alcohol use. ? Practicing safe sex. ? Taking vitamin and mineral supplements as recommended by your health care provider. What happens during an annual well check? The services and screenings done by your health care provider during your annual well check will depend on your age, overall health, lifestyle risk factors, and family history of disease. Counseling Your health care provider may ask you questions about your:  Alcohol use.  Tobacco use.  Drug use.  Emotional well-being.  Home and relationship well-being.  Sexual activity.  Eating habits.  Work and work environment.  Method of birth control.  Menstrual cycle.  Pregnancy history. Screening You may have the following tests or measurements:  Height, weight, and BMI.  Diabetes screening. This is done by checking your blood sugar (glucose) after you have not eaten for a while (fasting).  Blood pressure.  Lipid and cholesterol levels. These may be checked every 5 years starting at age 20.  Skin check.  Hepatitis C blood test.  Hepatitis B blood test.  Sexually transmitted disease (STD) testing.  BRCA-related cancer screening. This may be done if you have a family history of breast, ovarian, tubal, or peritoneal cancers.  Pelvic exam and Pap test. This may be done every 3 years starting at age 21. Starting at age 30, this may be done every 5  years if you have a Pap test in combination with an HPV test. Discuss your test results, treatment options, and if necessary, the need for more tests with your health care provider. Vaccines Your health care provider may recommend certain vaccines, such as:  Influenza vaccine. This is recommended every year.  Tetanus, diphtheria, and acellular pertussis (Tdap, Td) vaccine. You may need a Td booster every 10 years.  Varicella vaccine. You may need this if you have not been vaccinated.  HPV vaccine. If you are 26 or younger, you may need three doses over 6 months.  Measles, mumps, and rubella (MMR) vaccine. You may need at least one dose of MMR. You may also need a second dose.  Pneumococcal 13-valent conjugate (PCV13) vaccine. You may need this if you have certain conditions and were not previously vaccinated.  Pneumococcal polysaccharide (PPSV23) vaccine. You may need one or two doses if you smoke cigarettes or if you have certain conditions.  Meningococcal vaccine. One dose is recommended if you are age 19-21 years and a first-year college student living in a residence hall, or if you have one of several medical conditions. You may also need additional booster doses.  Hepatitis A vaccine. You may need this if you have certain conditions or if you travel or work in places where you may be exposed to hepatitis A.  Hepatitis B vaccine. You may need this if you have certain conditions or if you travel or work in places where you may be exposed to hepatitis B.  Haemophilus influenzae type b (Hib) vaccine. You may need this if you   have certain risk factors. Talk to your health care provider about which screenings and vaccines you need and how often you need them. This information is not intended to replace advice given to you by your health care provider. Make sure you discuss any questions you have with your health care provider. Document Released: 08/13/2001 Document Revised: 01/28/2017  Document Reviewed: 04/18/2015 Elsevier Interactive Patient Education  2019 [employer].

## 2018-08-04 LAB — LIPID PANEL
Chol/HDL Ratio: 3.9 ratio (ref 0.0–4.4)
Cholesterol, Total: 229 mg/dL — ABNORMAL HIGH (ref 100–199)
HDL: 59 mg/dL (ref >39)
LDL Calculated: 150 mg/dL — ABNORMAL HIGH (ref 0–99)
Triglycerides: 99 mg/dL (ref 0–149)
VLDL Cholesterol Cal: 20 mg/dL (ref 5–40)

## 2018-08-04 LAB — COMPREHENSIVE METABOLIC PANEL
ALT: 10 IU/L (ref 0–32)
AST: 17 IU/L (ref 0–40)
Albumin/Globulin Ratio: 1.2 (ref 1.2–2.2)
Albumin: 4.3 g/dL (ref 3.8–4.8)
Alkaline Phosphatase: 52 IU/L (ref 39–117)
BUN / CREAT RATIO: 14 (ref 9–23)
BUN: 9 mg/dL (ref 6–20)
CHLORIDE: 104 mmol/L (ref 96–106)
CO2: 17 mmol/L — ABNORMAL LOW (ref 20–29)
Calcium: 9.2 mg/dL (ref 8.7–10.2)
Creatinine, Ser: 0.63 mg/dL (ref 0.57–1.00)
GFR calc Af Amer: 132 mL/min/{1.73_m2} (ref >59)
GFR calc non Af Amer: 114 mL/min/{1.73_m2} (ref >59)
GLUCOSE: 81 mg/dL (ref 65–99)
Globulin, Total: 3.5 g/dL (ref 1.5–4.5)
POTASSIUM: 3.7 mmol/L (ref 3.5–5.2)
Sodium: 137 mmol/L (ref 134–144)
Total Protein: 7.8 g/dL (ref 6.0–8.5)

## 2018-08-04 LAB — CBC WITH DIFFERENTIAL/PLATELET
BASOS ABS: 0.1 10*3/uL (ref 0.0–0.2)
Basos: 1 %
EOS (ABSOLUTE): 0 10*3/uL (ref 0.0–0.4)
Eos: 0 %
HEMOGLOBIN: 7.9 g/dL — AB (ref 11.1–15.9)
Hematocrit: 26.9 % — ABNORMAL LOW (ref 34.0–46.6)
IMMATURE GRANS (ABS): 0 10*3/uL (ref 0.0–0.1)
IMMATURE GRANULOCYTES: 0 %
LYMPHS: 38 %
Lymphocytes Absolute: 2.4 10*3/uL (ref 0.7–3.1)
MCH: 19.5 pg — AB (ref 26.6–33.0)
MCHC: 29.4 g/dL — ABNORMAL LOW (ref 31.5–35.7)
MCV: 66 fL — ABNORMAL LOW (ref 79–97)
MONOCYTES: 9 %
Monocytes Absolute: 0.5 10*3/uL (ref 0.1–0.9)
NEUTROS ABS: 3.4 10*3/uL (ref 1.4–7.0)
NEUTROS PCT: 52 %
PLATELETS: 345 10*3/uL (ref 150–450)
RBC: 4.06 x10E6/uL (ref 3.77–5.28)
RDW: 16.7 % — ABNORMAL HIGH (ref 11.7–15.4)
WBC: 6.4 10*3/uL (ref 3.4–10.8)

## 2018-08-04 LAB — IRON,TIBC AND FERRITIN PANEL
Ferritin: 3 ng/mL — ABNORMAL LOW (ref 15–150)
Iron Saturation: 3 % — CL (ref 15–55)
Iron: 15 ug/dL — ABNORMAL LOW (ref 27–159)
Total Iron Binding Capacity: 471 ug/dL — ABNORMAL HIGH (ref 250–450)
UIBC: 456 ug/dL — ABNORMAL HIGH (ref 131–425)

## 2018-08-04 LAB — TSH: TSH: 1.06 u[IU]/mL (ref 0.450–4.500)

## 2018-08-04 LAB — RPR: RPR: NONREACTIVE

## 2018-08-04 LAB — HIV ANTIBODY (ROUTINE TESTING W REFLEX): HIV SCREEN 4TH GENERATION: NONREACTIVE

## 2018-08-04 LAB — T4, FREE: Free T4: 0.98 ng/dL (ref 0.82–1.77)

## 2018-08-05 ENCOUNTER — Other Ambulatory Visit: Payer: Self-pay | Admitting: Internal Medicine

## 2018-08-05 ENCOUNTER — Encounter: Payer: Self-pay | Admitting: Gastroenterology

## 2018-08-05 DIAGNOSIS — D509 Iron deficiency anemia, unspecified: Secondary | ICD-10-CM

## 2018-08-06 LAB — GC/CHLAMYDIA PROBE AMP
Chlamydia trachomatis, NAA: NEGATIVE
Neisseria gonorrhoeae by PCR: NEGATIVE

## 2018-08-08 ENCOUNTER — Ambulatory Visit: Payer: BC Managed Care – PPO | Admitting: Podiatry

## 2018-08-08 ENCOUNTER — Encounter: Payer: Self-pay | Admitting: Podiatry

## 2018-08-08 VITALS — BP 117/73 | HR 80 | Resp 16

## 2018-08-08 DIAGNOSIS — L600 Ingrowing nail: Secondary | ICD-10-CM

## 2018-08-08 MED ORDER — GENTAMICIN SULFATE 0.1 % EX CREA: 1.0000 "application " | TOPICAL_CREAM | Freq: Two times a day (BID) | CUTANEOUS | 0 refills | Status: DC

## 2018-08-08 NOTE — Patient Instructions (Signed)

## 2018-08-13 NOTE — Progress Notes (Signed)
   Subjective: Patient presents today for evaluation of pain to the medial border of the right hallux that began a few months ago. Patient is concerned for possible ingrown nail. Applying pressure to the toe increases the pain. She has been trimming the nail for treatment. Patient presents today for further treatment and evaluation.  Past Medical History:  Diagnosis Date  . Abnormal Pap smear    f/u was ok  . Anemia   . Infection    UTI  . Ovarian cyst   . Vaginal Pap smear, abnormal     Objective:  General: Well developed, nourished, in no acute distress, alert and oriented x3   Dermatology: Skin is warm, dry and supple bilateral. Medial border of the right hallux appears to be erythematous with evidence of an ingrowing nail. Pain on palpation noted to the border of the nail fold. The remaining nails appear unremarkable at this time. There are no open sores, lesions.  Vascular: Dorsalis Pedis artery and Posterior Tibial artery pedal pulses palpable. No lower extremity edema noted.   Neruologic: Grossly intact via light touch bilateral.  Musculoskeletal: Muscular strength within normal limits in all groups bilateral. Normal range of motion noted to all pedal and ankle joints.   Assesement: #1 Paronychia with ingrowing nail medial border of the right hallux  #2 Pain in toe #3 Incurvated nail  Plan of Care:  1. Patient evaluated.  2. Discussed treatment alternatives and plan of care. Explained nail avulsion procedure and post procedure course to patient. 3. Patient opted for permanent partial nail avulsion.  4. Prior to procedure, local anesthesia infiltration utilized using 3 ml of a 50:50 mixture of 2% plain lidocaine and 0.5% plain marcaine in a normal hallux block fashion and a betadine prep performed.  5. Partial permanent nail avulsion with chemical matrixectomy performed using [id number] applications of phenol followed by alcohol flush.  6. Light dressing applied. 7.  Prescription for Gentamicin cream provided to patient to use daily with a bandage.  8. Return to clinic in 2 weeks.   Edrick Kins, DPM [facility]  Dr. Edrick Kins, [address]                                        [place], [place] [address]                Office [phone]  Fax [phone]

## 2018-08-18 ENCOUNTER — Ambulatory Visit: Payer: BC Managed Care – PPO | Admitting: Gastroenterology

## 2018-08-18 ENCOUNTER — Other Ambulatory Visit: Payer: BC Managed Care – PPO

## 2018-08-18 ENCOUNTER — Other Ambulatory Visit (INDEPENDENT_AMBULATORY_CARE_PROVIDER_SITE_OTHER): Payer: BC Managed Care – PPO

## 2018-08-18 ENCOUNTER — Encounter: Payer: Self-pay | Admitting: Gastroenterology

## 2018-08-18 VITALS — BP 124/60 | HR 62 | Ht 61.5 in | Wt 158.0 lb

## 2018-08-18 DIAGNOSIS — K5900 Constipation, unspecified: Secondary | ICD-10-CM | POA: Diagnosis not present

## 2018-08-18 DIAGNOSIS — D508 Other iron deficiency anemias: Secondary | ICD-10-CM

## 2018-08-18 LAB — IGA: IgA: 553 mg/dL — ABNORMAL HIGH (ref 68–378)

## 2018-08-18 NOTE — Patient Instructions (Addendum)
You have been scheduled for an endoscopy and colonoscopy. Please follow the written instructions given to you at your visit today. Please pick up your prep supplies at the pharmacy within the next 1-3 days. If you use inhalers (even only as needed), please bring them with you on the day of your procedure.   Go to the basement for labs today  Take Benefiber 1 teaspoon 2-3 times a day or Fiber Choice tablets twice a day  If you are age 39 or older, your body mass index should be between 23-30. Your Body mass index is 29.37 kg/m. If this is out of the aforementioned range listed, please consider follow up with your Primary Care Provider.  If you are age 45 or younger, your body mass index should be between 19-25. Your Body mass index is 29.37 kg/m. If this is out of the aformentioned range listed, please consider follow up with your Primary Care Provider.    I appreciate the  opportunity to care for you  Thank You   Harl Bowie , MD

## 2018-08-18 NOTE — Progress Notes (Signed)
Tina [name]    [id number]    January 22, 1981  Primary Care Physician:Henson, Laurian Brim, NP-C  Referring Physician: [name], NP-C [place], [place] [address]  Chief complaint: Iron deficiency anemia  HPI: 38 year old female here for new patient consult with iron deficiency anemia.  She has been diagnosed with iron deficiency during her pregnancy and thinks she has been iron deficient since she was a teenager.  She has chronic fatigue, gets cold very easily with low cold tolerance. She had reaction to IV iron infusion about 10 years ago.  Does not tolerate oral tablets well and is currently not on any iron supplements. Denies any dark stool or blood in stool.  No specific GI symptoms. No family history of IBD or colon cancer.  Patient has regular menstrual cycle.  She has been evaluated by GYN, did not think anything was abnormal and was recommended GI work-up to exclude GI related blood loss for chronic severe iron deficiency anemia. Menstrual bleeding for about 7 days with heavy bleeding on first 3 days Light days changes tampoon +pad 2 times in a day, but on heavy days changes every 3-4 hours  Hemoglobin 7.9, hematocrit 26.9, MCV 66, WBC 6.4, platelets 345, ferritin 3, iron saturation 3 and iron level 15.  Review of system positive for chronic constipation, improves with dietary changes when she eats high-fiber diet with fruits and vegetables.  Denies any nausea, vomiting, abdominal pain, melena or bright red blood per rectum    Outpatient Encounter Medications as of 08/18/2018  Medication Sig  . gentamicin cream (GARAMYCIN) 0.1 % Apply 1 application topically 2 (two) times daily.   No facility-administered encounter medications on file as of 08/18/2018.     Allergies as of 08/18/2018 - Review Complete 08/18/2018  Allergen Reaction Noted  . Diflucan [fluconazole] Anaphylaxis and Rash 02/14/2013  . Ferrous sulfate Anaphylaxis  05/07/2013  . Monistat [miconazole]  03/31/2013  . Tdap [tetanus-diphth-acell pertussis] Swelling 05/07/2013    Past Medical History:  Diagnosis Date  . Abnormal Pap smear    f/u was ok  . Anemia   . Infection    UTI  . Ovarian cyst   . Vaginal Pap smear, abnormal     Past Surgical History:  Procedure Laterality Date  . NO PAST SURGERIES    . TUBAL LIGATION Bilateral 09/02/2013   Procedure: POST PARTUM TUBAL LIGATION;  Surgeon: Guss Bunde, MD;  Location: [facility];  Service: Gynecology;  Laterality: Bilateral;    Family History  Problem Relation Age of Onset  . Prostate cancer Paternal Grandfather   . Asthma Mother   . Gestational diabetes Mother   . Hyperlipidemia Father   . Hypertension Father     Social History   Socioeconomic History  . Marital status: Married    Spouse name: Not on file  . Number of children: 4  . Years of education: Not on file  . Highest education level: Not on file  Occupational History  . Not on file  Social Needs  . Financial resource strain: Not on file  . Food insecurity:    Worry: Not on file    Inability: Not on file  . Transportation needs:    Medical: Not on file    Non-medical: Not on file  Tobacco Use  . Smoking status: Never Smoker  . Smokeless tobacco: Never Used  Substance and Sexual Activity  . Alcohol use: No  .  Drug use: No  . Sexual activity: Yes    Birth control/protection: Surgical    Comment: tubal in 2015   Lifestyle  . Physical activity:    Days per week: Not on file    Minutes per session: Not on file  . Stress: Not on file  Relationships  . Social connections:    Talks on phone: Not on file    Gets together: Not on file    Attends religious service: Not on file    Active member of club or organization: Not on file    Attends meetings of clubs or organizations: Not on file    Relationship status: Not on file  . Intimate partner violence:    Fear of current or ex partner: Not on file     Emotionally abused: Not on file    Physically abused: Not on file    Forced sexual activity: Not on file  Other Topics Concern  . 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    Lives in [place], has 4 kids and 2 step kids.       Review of systems: Review of Systems  Constitutional: Negative for fever and chills.  HENT: Negative.   Eyes: Negative for blurred vision.  Respiratory: Negative for cough, shortness of breath and wheezing.   Cardiovascular: Negative for chest pain and palpitations.  Gastrointestinal: as per HPI Genitourinary: Negative for dysuria, urgency, frequency and hematuria.  Musculoskeletal: Negative for myalgias, back pain and joint pain.  Skin: Negative for itching and rash.  Neurological: Negative for dizziness, tremors, focal weakness, seizures and loss of consciousness.  Endo/Heme/Allergies: Positive for seasonal allergies.  Psychiatric/Behavioral: Negative for depression, suicidal ideas and hallucinations.  All other systems reviewed and are negative.   Physical Exam: Vitals:   08/18/18 0926  BP: 124/60  Pulse: 62   Body mass index is 29.37 kg/m. Gen:      No acute distress HEENT:  EOMI, sclera anicteric Neck:     No masses; no thyromegaly Lungs:    Clear to auscultation bilaterally; normal respiratory effort CV:         Regular rate and rhythm; no murmurs Abd:      + bowel sounds; soft, non-tender; no palpable masses, no distension Ext:    No edema; adequate peripheral perfusion Skin:      Warm and dry; no rash Neuro: alert and oriented x 3 Psych: normal mood and affect  Data Reviewed:  Reviewed labs, radiology imaging, old records and pertinent past GI work up   Assessment and Plan/Recommendations:  38 year old female with severe iron deficiency anemia.  Likely etiology menstrual blood loss. Will need to exclude GI malabsorption or chronic occult GI blood loss Schedule for EGD and  colonoscopy to exclude potential GI source for chronic blood loss We will obtain duodenal biopsies, also check TTG IgA antibody to rule out celiac disease Refer to hematology for management of severe iron deficiency anemia, she had reaction to IV iron infusion about 10 years ago, may benefit from IV iron as she is unable to tolerate oral iron supplements  Chronic constipation: Increase dietary fiber and fluid intake Benefiber 1 teaspoon 3 times daily with meals  The risks and benefits as well as alternatives of endoscopic procedure(s) have been discussed and reviewed. All questions answered. The patient agrees to proceed.   Damaris Hippo , MD [phone]    CC: [name], NP-C

## 2018-08-19 ENCOUNTER — Ambulatory Visit (AMBULATORY_SURGERY_CENTER): Payer: BC Managed Care – PPO | Admitting: Gastroenterology

## 2018-08-19 ENCOUNTER — Encounter: Payer: Self-pay | Admitting: Certified Registered Nurse Anesthetist

## 2018-08-19 ENCOUNTER — Encounter: Payer: Self-pay | Admitting: *Deleted

## 2018-08-19 VITALS — BP 114/68 | HR 73 | Temp 97.8°F | Resp 13 | Ht 61.0 in | Wt 158.0 lb

## 2018-08-19 DIAGNOSIS — D509 Iron deficiency anemia, unspecified: Secondary | ICD-10-CM | POA: Diagnosis present

## 2018-08-19 DIAGNOSIS — K648 Other hemorrhoids: Secondary | ICD-10-CM

## 2018-08-19 DIAGNOSIS — D125 Benign neoplasm of sigmoid colon: Secondary | ICD-10-CM

## 2018-08-19 DIAGNOSIS — K635 Polyp of colon: Secondary | ICD-10-CM

## 2018-08-19 MED ORDER — SODIUM CHLORIDE 0.9 % IV SOLN: 500.0000 mL | Freq: Once | INTRAVENOUS | Status: DC

## 2018-08-19 NOTE — Op Note (Signed)
[facility] Patient Name: Tina [name] Procedure Date: 08/19/2018 1:55 PM MRN: [id number] Endoscopist: Mauri Pole , MD Age: 38 Referring MD:  Date of Birth: 10/31/1980 Gender: Female Account #: [id number] Procedure:                Colonoscopy Indications:              Unexplained iron deficiency anemia Medicines:                Monitored Anesthesia Care Procedure:                Pre-Anesthesia Assessment:                           - Prior to the procedure, a History and Physical                            was performed, and patient medications and                            allergies were reviewed. The patient's tolerance of                            previous anesthesia was also reviewed. The risks                            and benefits of the procedure and the sedation                            options and risks were discussed with the patient.                            All questions were answered, and informed consent                            was obtained. Prior Anticoagulants: The patient has                            taken no previous anticoagulant or antiplatelet                            agents. ASA Grade Assessment: II - A patient with                            mild systemic disease. After reviewing the risks                            and benefits, the patient was deemed in                            satisfactory condition to undergo the procedure.                           After obtaining informed consent, the colonoscope  was passed under direct vision. Throughout the                            procedure, the patient's blood pressure, pulse, and                            oxygen saturations were monitored continuously. The                            Model PCF-H190DL [id number]) scope was introduced                            through the anus and advanced to the the terminal                            ileum, with  identification of the appendiceal                            orifice and IC valve. The colonoscopy was performed                            without difficulty. The patient tolerated the                            procedure well. The quality of the bowel                            preparation was excellent. The ileocecal valve,                            appendiceal orifice, and rectum were photographed. Scope In: 2:21:13 PM Scope Out: 2:33:35 PM Scope Withdrawal Time: 0 hours 9 minutes 44 seconds  Total Procedure Duration: 0 hours 12 minutes 22 seconds  Findings:                 The perianal and digital rectal examinations were                            normal.                           A 2 mm polyp was found in the sigmoid colon. The                            polyp was sessile. The polyp was removed with a                            cold biopsy forceps. Resection and retrieval were                            complete.                           Non-bleeding internal hemorrhoids were found during  retroflexion. The hemorrhoids were small. Complications:            No immediate complications. Estimated Blood Loss:     Estimated blood loss was minimal. Impression:               - One 2 mm polyp in the sigmoid colon, removed with                            a cold biopsy forceps. Resected and retrieved.                           - Non-bleeding internal hemorrhoids. Recommendation:           - Patient has a contact number available for                            emergencies. The signs and symptoms of potential                            delayed complications were discussed with the                            patient. Return to normal activities tomorrow.                            Written discharge instructions were provided to the                            patient.                           - Resume previous diet.                           - Continue present  medications.                           - Await pathology results.                           - Repeat colonoscopy in 7 to 10 years for                            surveillance based on pathology results.                           - Refer to hematology for further evaluation and                            management of severe iron deficiency anemia and                            management. To discuss IV iron therapy. She has h/o                            reaction to IV iron 10 years ago. [name]  Bary Richard, MD 08/19/2018 2:41:51 PM This report has been signed electronically.

## 2018-08-19 NOTE — Progress Notes (Signed)
Pt's states no medical or surgical changes since previsit or office visit. 

## 2018-08-19 NOTE — Progress Notes (Signed)
PT taken to PACU. Monitors in place. VSS. Report given to RN. 

## 2018-08-19 NOTE — Progress Notes (Signed)
Called to room to assist during endoscopic procedure.  Patient ID and intended procedure confirmed with present staff. Received instructions for my participation in the procedure from the performing physician.  

## 2018-08-19 NOTE — Op Note (Signed)
[facility] Patient Name: Tina [name] Procedure Date: 08/19/2018 1:55 PM MRN: [id number] Endoscopist: Mauri Pole , MD Age: 38 Referring MD:  Date of Birth: 06-12-1981 Gender: Female Account #: [id number] Procedure:                Upper GI endoscopy Indications:              Suspected upper gastrointestinal bleeding in                            patient with unexplained iron deficiency anemia Medicines:                Monitored Anesthesia Care Procedure:                Pre-Anesthesia Assessment:                           - Prior to the procedure, a History and Physical                            was performed, and patient medications and                            allergies were reviewed. The patient's tolerance of                            previous anesthesia was also reviewed. The risks                            and benefits of the procedure and the sedation                            options and risks were discussed with the patient.                            All questions were answered, and informed consent                            was obtained. Prior Anticoagulants: The patient has                            taken no previous anticoagulant or antiplatelet                            agents. ASA Grade Assessment: II - A patient with                            mild systemic disease. After reviewing the risks                            and benefits, the patient was deemed in                            satisfactory condition to undergo the procedure.  After obtaining informed consent, the endoscope was                            passed under direct vision. Throughout the                            procedure, the patient's blood pressure, pulse, and                            oxygen saturations were monitored continuously. The                            Endoscope was introduced through the mouth, and   advanced to the second part of duodenum. The upper                            GI endoscopy was accomplished without difficulty.                            The patient tolerated the procedure well. Scope In: Scope Out: Findings:                 The esophagus was normal.                           The entire examined stomach was normal. Biopsies                            were taken with a cold forceps for Helicobacter                            pylori testing.                           The first portion of the duodenum and second                            portion of the duodenum were normal. Biopsies for                            histology were taken with a cold forceps for                            evaluation of celiac disease. Complications:            No immediate complications. Estimated Blood Loss:     Estimated blood loss was minimal. Impression:               - Normal esophagus.                           - Normal stomach. Biopsied.                           - Normal first portion of the duodenum and second  portion of the duodenum. Biopsied. Recommendation:           - Patient has a contact number available for                            emergencies. The signs and symptoms of potential                            delayed complications were discussed with the                            patient. Return to normal activities tomorrow.                            Written discharge instructions were provided to the                            patient.                           - See the other procedure note for documentation of                            additional recommendations. Mauri Pole, MD 08/19/2018 2:37:39 PM This report has been signed electronically.

## 2018-08-19 NOTE — Patient Instructions (Signed)
Handouts Provided:  Polyps  YOU HAD AN ENDOSCOPIC PROCEDURE TODAY AT THE [facility]:   Refer to the procedure report that was given to you for any specific questions about what was found during the examination.  If the procedure report does not answer your questions, please call your gastroenterologist to clarify.  If you requested that your care partner not be given the details of your procedure findings, then the procedure report has been included in a sealed envelope for you to review at your convenience later.  YOU SHOULD EXPECT: Some feelings of bloating in the abdomen. Passage of more gas than usual.  Walking can help get rid of the air that was put into your GI tract during the procedure and reduce the bloating. If you had a lower endoscopy (such as a colonoscopy or flexible sigmoidoscopy) you may notice spotting of blood in your stool or on the toilet paper. If you underwent a bowel prep for your procedure, you may not have a normal bowel movement for a few days.  Please Note:  You might notice some irritation and congestion in your nose or some drainage.  This is from the oxygen used during your procedure.  There is no need for concern and it should clear up in a day or so.  SYMPTOMS TO REPORT IMMEDIATELY:   Following lower endoscopy (colonoscopy or flexible sigmoidoscopy):  Excessive amounts of blood in the stool  Significant tenderness or worsening of abdominal pains  Swelling of the abdomen that is new, acute  Fever of 100F or higher   Following upper endoscopy (EGD)  Vomiting of blood or coffee ground material  New chest pain or pain under the shoulder blades  Painful or persistently difficult swallowing  New shortness of breath  Fever of 100F or higher  Black, tarry-looking stools  For urgent or emergent issues, a gastroenterologist can be reached at any hour by calling [phone].   DIET:  We do recommend a small meal at first, but then you may proceed  to your regular diet.  Drink plenty of fluids but you should avoid alcoholic beverages for 24 hours.  ACTIVITY:  You should plan to take it easy for the rest of today and you should NOT DRIVE or use heavy machinery until tomorrow (because of the sedation medicines used during the test).    FOLLOW UP: Our staff will call the number listed on your records the next business day following your procedure to check on you and address any questions or concerns that you may have regarding the information given to you following your procedure. If we do not reach you, we will leave a message.  However, if you are feeling well and you are not experiencing any problems, there is no need to return our call.  We will assume that you have returned to your regular daily activities without incident.  If any biopsies were taken you will be contacted by phone or by letter within the next 1-3 weeks.  Please call us at [phone] if you have not heard about the biopsies in 3 weeks.    SIGNATURES/CONFIDENTIALITY: You and/or your care partner have signed paperwork which will be entered into your electronic medical record.  These signatures attest to the fact that that the information above on your After Visit Summary has been reviewed and is understood.  Full responsibility of the confidentiality of this discharge information lies with you and/or your care-partner.  

## 2018-08-20 ENCOUNTER — Encounter: Payer: Self-pay | Admitting: Gastroenterology

## 2018-08-20 ENCOUNTER — Telehealth: Payer: Self-pay

## 2018-08-20 LAB — TISSUE TRANSGLUTAMINASE ABS,IGG,IGA
(TTG) AB, IGA: 1 U/mL
(tTG) Ab, IgG: 4 U/mL

## 2018-08-20 NOTE — Telephone Encounter (Signed)
  Follow up Call-  Call back number 08/19/2018  Post procedure Call Back phone  # [phone]  Permission to leave phone message Yes  Some recent data might be hidden     Patient questions:  Do you have a fever, pain , or abdominal swelling? No. Pain Score  0 *  Have you tolerated food without any problems? Yes.    Have you been able to return to your normal activities? Yes.    Do you have any questions about your discharge instructions: Diet   No. Medications  No. Follow up visit  No.  Do you have questions or concerns about your Care? No.  Actions: * If pain score is 4 or above: No action needed, pain <4.

## 2018-08-24 ENCOUNTER — Ambulatory Visit (INDEPENDENT_AMBULATORY_CARE_PROVIDER_SITE_OTHER): Payer: Self-pay

## 2018-08-24 ENCOUNTER — Other Ambulatory Visit: Payer: Self-pay

## 2018-08-24 DIAGNOSIS — D509 Iron deficiency anemia, unspecified: Secondary | ICD-10-CM

## 2018-08-24 DIAGNOSIS — L600 Ingrowing nail: Secondary | ICD-10-CM

## 2018-08-24 NOTE — Patient Instructions (Signed)

## 2018-08-25 NOTE — Progress Notes (Signed)
Patient is here today for follow-up appointment, recent procedure performed on 08/08/2018, removal of ingrown right hallux nail border.  She states that overall the area is healing well and is not painful at this time.  No redness, no swelling, no erythema, no drainage, no other signs and symptoms of infection.  Area is scabbed over at this time is healing well.  Discussed signs and symptoms of infection, verbal and written instructions were given to the patient.  She is to follow-up as needed with any acute symptom changes.

## 2018-08-26 ENCOUNTER — Telehealth: Payer: Self-pay | Admitting: Internal Medicine

## 2018-08-26 NOTE — Telephone Encounter (Signed)
Received a referral from Dr. Silverio Decamp for anemia. Pt has been cld and scheduled to see Dr. Julien Nordmann on 2/29 at 1045am. Pt aware to arrive 30 minutes early to be checked in on time and labs at 1030am.

## 2018-08-28 ENCOUNTER — Other Ambulatory Visit: Payer: Self-pay | Admitting: Internal Medicine

## 2018-08-28 DIAGNOSIS — D509 Iron deficiency anemia, unspecified: Secondary | ICD-10-CM

## 2018-08-29 ENCOUNTER — Inpatient Hospital Stay: Payer: BC Managed Care – PPO

## 2018-08-29 ENCOUNTER — Telehealth: Payer: Self-pay | Admitting: Internal Medicine

## 2018-08-29 ENCOUNTER — Inpatient Hospital Stay: Payer: BC Managed Care – PPO | Attending: Internal Medicine | Admitting: Internal Medicine

## 2018-08-29 ENCOUNTER — Encounter: Payer: Self-pay | Admitting: Internal Medicine

## 2018-08-29 VITALS — BP 121/57 | HR 78 | Temp 98.5°F | Resp 18 | Ht 61.0 in | Wt 158.9 lb

## 2018-08-29 DIAGNOSIS — N920 Excessive and frequent menstruation with regular cycle: Secondary | ICD-10-CM | POA: Insufficient documentation

## 2018-08-29 DIAGNOSIS — R5383 Other fatigue: Secondary | ICD-10-CM | POA: Insufficient documentation

## 2018-08-29 DIAGNOSIS — D509 Iron deficiency anemia, unspecified: Secondary | ICD-10-CM | POA: Insufficient documentation

## 2018-08-29 DIAGNOSIS — Z8042 Family history of malignant neoplasm of prostate: Secondary | ICD-10-CM | POA: Insufficient documentation

## 2018-08-29 DIAGNOSIS — F5089 Other specified eating disorder: Secondary | ICD-10-CM | POA: Insufficient documentation

## 2018-08-29 LAB — COMPREHENSIVE METABOLIC PANEL
ALT: 13 U/L (ref 0–44)
AST: 17 U/L (ref 15–41)
Albumin: 3.9 g/dL (ref 3.5–5.0)
Alkaline Phosphatase: 48 U/L (ref 38–126)
Anion gap: 7 (ref 5–15)
BUN: 9 mg/dL (ref 6–20)
CO2: 23 mmol/L (ref 22–32)
Calcium: 8.7 mg/dL — ABNORMAL LOW (ref 8.9–10.3)
Chloride: 107 mmol/L (ref 98–111)
Creatinine, Ser: 0.67 mg/dL (ref 0.44–1.00)
GFR calc Af Amer: >60 mL/min (ref >60)
GFR calc non Af Amer: >60 mL/min (ref >60)
GLUCOSE: 86 mg/dL (ref 70–99)
Potassium: 3.4 mmol/L — ABNORMAL LOW (ref 3.5–5.1)
Sodium: 137 mmol/L (ref 135–145)
Total Bilirubin: 0.4 mg/dL (ref 0.3–1.2)
Total Protein: 7.8 g/dL (ref 6.5–8.1)

## 2018-08-29 LAB — CBC WITH DIFFERENTIAL (CANCER CENTER ONLY)
Abs Immature Granulocytes: 0.01 10*3/uL (ref 0.00–0.07)
Basophils Absolute: 0 10*3/uL (ref 0.0–0.1)
Basophils Relative: 1 %
EOS PCT: 1 %
Eosinophils Absolute: 0 10*3/uL (ref 0.0–0.5)
HCT: 28.1 % — ABNORMAL LOW (ref 36.0–46.0)
Hemoglobin: 8 g/dL — ABNORMAL LOW (ref 12.0–15.0)
Immature Granulocytes: 0 %
LYMPHS PCT: 48 %
Lymphs Abs: 2 10*3/uL (ref 0.7–4.0)
MCH: 19.5 pg — ABNORMAL LOW (ref 26.0–34.0)
MCHC: 28.5 g/dL — ABNORMAL LOW (ref 30.0–36.0)
MCV: 68.5 fL — ABNORMAL LOW (ref 80.0–100.0)
Monocytes Absolute: 0.3 10*3/uL (ref 0.1–1.0)
Monocytes Relative: 8 %
Neutro Abs: 1.8 10*3/uL (ref 1.7–7.7)
Neutrophils Relative %: 42 %
Platelet Count: 300 10*3/uL (ref 150–400)
RBC: 4.1 MIL/uL (ref 3.87–5.11)
RDW: 17.3 % — ABNORMAL HIGH (ref 11.5–15.5)
WBC Count: 4.1 10*3/uL (ref 4.0–10.5)
nRBC: 0 % (ref 0.0–0.2)

## 2018-08-29 LAB — RETICULOCYTES
Immature Retic Fract: 29.8 % — ABNORMAL HIGH (ref 2.3–15.9)
RBC.: 4.1 MIL/uL (ref 3.87–5.11)
Retic Count, Absolute: 50 10*3/uL (ref 19.0–186.0)
Retic Ct Pct: 1.2 % (ref 0.4–3.1)

## 2018-08-29 LAB — LACTATE DEHYDROGENASE: LDH: 152 U/L (ref 98–192)

## 2018-08-29 MED ORDER — INTEGRA PLUS PO CAPS: 1.0000 | ORAL_CAPSULE | Freq: Every morning | ORAL | 2 refills | Status: AC

## 2018-08-29 NOTE — Progress Notes (Signed)
Tina [name]:(336) [phone]   [phone]  CONSULT NOTE  REFERRING PHYSICIAN: Dr. Diona Foley  REASON FOR CONSULTATION:  [name] with severe iron deficiency anemia  HPI Tina [name] is a 38 y.o. female with past medical history significant for asthma, urinary tract infection, ovarian cyst as well as tubal ligation.  The patient also has a history of iron deficiency anemia for more than 20 years.  She was not taking any iron supplements because of intolerance with constipation.  She mentions that her.  Are irregular and she sometimes get to.  In 1 months and then skipping months.  She is not followed by a gynecologist at this point but she is scheduled to see 1 next week.  She was seen recently by gastroenterologist and she has upper endoscopy as well as colonoscopy that were unremarkable for any source of her anemia.  The patient received iron infusion many years ago and she developed an allergic reaction to the infusion require hospitalization but she is not sure of the type of the iron that she received.  I am not sure if it was iron dextran or 1 of the newer version of iron infusion.  She is here today for evaluation and recommendation regarding her condition. The patient continues to complain of fatigue and lack of energy and feeling cold all the time.  She also has craving to ice.  She denied having any chest pain, shortness of breath, cough or hemoptysis.  She denied having any fever or chills.  She has no nausea, vomiting, diarrhea or constipation.  She has no headache or visual changes. Family history significant for paternal grand father with prostate cancer, mother had anemia, father had hypertension and dyslipidemia, sister had anemia. The patient is married and has 4 biological children and 2 stepchildren.  She works for the [employer] system.  She has no history for smoking, alcohol or drug  abuse.  HPI  Past Medical History:  Diagnosis Date  . Abnormal Pap smear    f/u was ok  . Anemia   . Infection    UTI  . Ovarian cyst   . Vaginal Pap smear, abnormal     Past Surgical History:  Procedure Laterality Date  . NO PAST SURGERIES    . TUBAL LIGATION Bilateral 09/02/2013   Procedure: POST PARTUM TUBAL LIGATION;  Surgeon: Guss Bunde, MD;  Location: [facility];  Service: Gynecology;  Laterality: Bilateral;    Family History  Problem Relation Age of Onset  . Prostate cancer Paternal Grandfather   . Asthma Mother   . Gestational diabetes Mother   . Hyperlipidemia Father   . Hypertension Father     Social History Social History   Tobacco Use  . Smoking status: Never Smoker  . Smokeless tobacco: Never Used  Substance Use Topics  . Alcohol use: No  . Drug use: No    Allergies  Allergen Reactions  . Diflucan [Fluconazole] Anaphylaxis and Rash    During pregnancy  . Ferrous Sulfate Anaphylaxis    Iv dose  . Monistat [Miconazole]     Burning- during pregnancy  . Tdap [Tetanus-Diphth-Acell Pertussis] Swelling    fever    Current Outpatient Medications  Medication Sig Dispense Refill  . gentamicin cream (GARAMYCIN) 0.1 % Apply 1 application topically 2 (two) times daily. 15 g 0   No current facility-administered medications for this visit.     Review of Systems  Constitutional: positive for fatigue Eyes: negative Ears, nose, mouth, throat, and face: negative Respiratory: negative Cardiovascular: negative Gastrointestinal: negative Genitourinary:negative Integument/breast: negative Hematologic/lymphatic: negative Musculoskeletal:negative Neurological: negative Behavioral/Psych: negative Endocrine: negative Allergic/Immunologic: negative  Physical Exam  WUJ:WJXBJ, healthy, no distress, well nourished and well developed SKIN: skin color, texture, turgor are normal, no rashes or significant lesions HEAD: Normocephalic, No masses, lesions,  tenderness or abnormalities EYES: normal EARS: External ears normal OROPHARYNX:no exudate, no erythema and lips, buccal mucosa, and tongue normal  NECK: supple, no adenopathy, no JVD LYMPH:  no palpable lymphadenopathy, no hepatosplenomegaly BREAST:not examined LUNGS: clear to auscultation , and palpation HEART: regular rate & rhythm, no murmurs and no gallops ABDOMEN:abdomen soft, non-tender, normal bowel sounds and no masses or organomegaly BACK: Back symmetric, no curvature., No CVA tenderness EXTREMITIES:no joint deformities, effusion, or inflammation, no edema  NEURO: alert & oriented x 3 with fluent speech, no focal motor/sensory deficits  PERFORMANCE STATUS: ECOG 1  LABORATORY DATA: Lab Results  Component Value Date   WBC 6.4 08/03/2018   HGB 7.9 (L) 08/03/2018   HCT 26.9 (L) 08/03/2018   MCV 66 (L) 08/03/2018   PLT 345 08/03/2018      Chemistry      Component Value Date/Time   NA 137 08/03/2018 1605   K 3.7 08/03/2018 1605   CL 104 08/03/2018 1605   CO2 17 (L) 08/03/2018 1605   BUN 9 08/03/2018 1605   CREATININE 0.63 08/03/2018 1605      Component Value Date/Time   CALCIUM 9.2 08/03/2018 1605   ALKPHOS 52 08/03/2018 1605   AST 17 08/03/2018 1605   ALT 10 08/03/2018 1605   BILITOT <0.2 08/03/2018 1605       RADIOGRAPHIC STUDIES: No results found.  ASSESSMENT: This is a very pleasant [name] with severe iron deficiency anemia likely secondary to menorrhagia.  The patient is not currently on any iron supplements.   PLAN: I had a lengthy discussion with the patient today about her condition and treatment options.  I order several studies for evaluation of her anemia including repeat CBC, comprehensive metabolic panel, LDH, iron study, ferritin, reticulocyte count as well as serum protein electrophoresis with immunofixation and hemoglobin electrophoresis. Her CBC today showed the persistent severe microcytic anemia. I discussed  with the patient her treatment options including starting her on oral iron tablets with Integra +1 capsule p.o. daily. Unfortunately the patient had severe allergic reaction to the previous iron infusion and I am not clear about the product that she received in the past.  We will start with the oral iron tablets for now and if not tolerated or no improvement in her condition, I would consider her for treatment with Feraheme infusion with premedication with steroids and Benadryl. I will arrange for the patient to come back for follow-up visit in 1 months for evaluation with repeat CBC, iron study and ferritin. For the menorrhagia she was advised to keep her appointment with her gynecologist. The patient was advised to call immediately if she has any other concerning symptoms in the interval. The patient voices understanding of current disease status and treatment options and is in agreement with the current care plan.  All questions were answered. The patient knows to call the clinic with any problems, questions or concerns. We can certainly see the patient much sooner if necessary.  Thank you so much for allowing me to participate in the care of Foley. I will continue to follow up  the patient with you and assist in her care.  I spent 40 minutes counseling the patient face to face. The total time spent in the appointment was 60 minutes.  Disclaimer: This note was dictated with voice recognition software. Similar sounding words can inadvertently be transcribed and may not be corrected upon review.   Eilleen Kempf August 29, 2018, 10:28 AM

## 2018-08-29 NOTE — Telephone Encounter (Signed)
Gave avs and calendar ° °

## 2018-08-31 LAB — IRON AND TIBC
Iron: 14 ug/dL — ABNORMAL LOW (ref 41–142)
Saturation Ratios: 3 % — ABNORMAL LOW (ref 21–57)
TIBC: 436 ug/dL (ref 236–444)
UIBC: 422 ug/dL — ABNORMAL HIGH (ref 120–384)

## 2018-08-31 LAB — FERRITIN

## 2018-09-01 LAB — MULTIPLE MYELOMA PANEL, SERUM
Albumin SerPl Elph-Mcnc: 3.7 g/dL (ref 2.9–4.4)
Albumin/Glob SerPl: 1 (ref 0.7–1.7)
Alpha 1: 0.2 g/dL (ref 0.0–0.4)
Alpha2 Glob SerPl Elph-Mcnc: 0.8 g/dL (ref 0.4–1.0)
B-Globulin SerPl Elph-Mcnc: 1.5 g/dL — ABNORMAL HIGH (ref 0.7–1.3)
GLOBULIN, TOTAL: 3.9 g/dL (ref 2.2–3.9)
Gamma Glob SerPl Elph-Mcnc: 1.4 g/dL (ref 0.4–1.8)
IGA: 572 mg/dL — AB (ref 87–352)
IGM (IMMUNOGLOBULIN M), SRM: 171 mg/dL (ref 26–217)
IgG (Immunoglobin G), Serum: 1483 mg/dL (ref 700–1600)
Total Protein ELP: 7.6 g/dL (ref 6.0–8.5)

## 2018-09-01 LAB — HEMOGLOBINOPATHY EVALUATION
Hgb A2 Quant: 1.5 % — ABNORMAL LOW (ref 1.8–3.2)
Hgb A: 98.5 % (ref 96.4–98.8)
Hgb C: 0 %
Hgb F Quant: 0 % (ref 0.0–2.0)
Hgb S Quant: 0 %
Hgb Variant: 0 %

## 2018-09-09 ENCOUNTER — Other Ambulatory Visit: Payer: Self-pay

## 2018-09-09 ENCOUNTER — Ambulatory Visit: Payer: BC Managed Care – PPO | Admitting: Women's Health

## 2018-09-09 ENCOUNTER — Encounter: Payer: Self-pay | Admitting: Women's Health

## 2018-09-09 VITALS — BP 122/80 | Ht 61.5 in | Wt 158.0 lb

## 2018-09-09 DIAGNOSIS — Z01419 Encounter for gynecological examination (general) (routine) without abnormal findings: Secondary | ICD-10-CM | POA: Diagnosis not present

## 2018-09-09 DIAGNOSIS — N760 Acute vaginitis: Secondary | ICD-10-CM

## 2018-09-09 DIAGNOSIS — B9689 Other specified bacterial agents as the cause of diseases classified elsewhere: Secondary | ICD-10-CM | POA: Diagnosis not present

## 2018-09-09 DIAGNOSIS — N898 Other specified noninflammatory disorders of vagina: Secondary | ICD-10-CM | POA: Diagnosis not present

## 2018-09-09 DIAGNOSIS — N920 Excessive and frequent menstruation with regular cycle: Secondary | ICD-10-CM | POA: Diagnosis not present

## 2018-09-09 LAB — WET PREP FOR TRICH, YEAST, CLUE

## 2018-09-09 MED ORDER — METRONIDAZOLE 500 MG PO TABS: 500.0000 mg | ORAL_TABLET | Freq: Two times a day (BID) | ORAL | 0 refills | Status: DC

## 2018-09-09 NOTE — Progress Notes (Signed)
Roma Bondar [birth year] [id number]    History:    Presents for annual exam.  Cycles every 1 to 2 months heavy flow lasting 5 to 7 days.  Had an abnormal Pap many years ago with negative biopsy, 2019 normal Pap with negative HR HPV typing.  BTL.  History of anemia currently on iron supplement per primary care.  Past medical history, past surgical history, family history and social history were all reviewed and documented in the EPIC chart.  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  4 children ages 68, 16, 57 and 4+2 stepchildren ages 110 and 18 all doing well.  Mother and sister anemia, father hypertension, hypercholesteremia  ROS:  A ROS was performed and pertinent positives and negatives are included.  Exam:  Vitals:   09/09/18 1551  BP: 122/80  Weight: 158 lb (71.7 kg)  Height: 5' 1.5" (1.562 m)   Body mass index is 29.37 kg/m.   General appearance:  Normal Thyroid:  Symmetrical, normal in size, without palpable masses or nodularity. Respiratory  Auscultation:  Clear without wheezing or rhonchi Cardiovascular  Auscultation:  Regular rate, without rubs, murmurs or gallops  Edema/varicosities:  Not grossly evident Abdominal  Soft,nontender, without masses, guarding or rebound.  Liver/spleen:  No organomegaly noted  Hernia:  None appreciated  Skin  Inspection:  Grossly normal   Breasts: Examined lying and sitting.     Right: Without masses, retractions, discharge or axillary adenopathy.     Left: Without masses, retractions, discharge or axillary adenopathy. Gentitourinary   Inguinal/mons:  Normal without inguinal adenopathy  External genitalia:  Normal  BUS/Urethra/Skene's glands:  Normal  Vagina: Parent white discharge with odor, wet prep positive for clues, TNTC bacteria l  Cervix:  Normal  Uterus:   normal in size, shape and contour.  Midline and mobile  Adnexa/parametria:     Rt: Without masses or tenderness.   Lt: Without masses or tenderness.  Anus and  perineum: Normal  Digital rectal exam: Normal sphincter tone without palpated masses or tenderness  Assessment/Plan:  38 y.o. MBF G4, P4 +2 step children for annual exam.    Cycles every  1 to 2 months/menorrhagia/BTL Chronic anemia-primary care manages labs and meds Bacterial vaginosis  Plan: Options reviewed, will schedule sonohysterogram with Dr. Phineas Real, reviewed endometrial ablation pros and cons may be helpful for cycle control.  Flagyl 500 twice daily for 7 days, alcohol precautions reviewed.  SBEs, mammogram at 39, calcium rich foods, vitamin D 1000 daily encouraged.  Reviewed importance of regular exercise, green leafy vegetables, decreasing calorie/carbs..  Pap normal 2019, new screening guidelines reviewed.    Huel Cote [facility], 4:50 PM 09/09/2018

## 2018-09-09 NOTE — Patient Instructions (Addendum)
Bacterial Vaginosis  Bacterial vaginosis is an infection of the vagina. It happens when too many normal germs (healthy bacteria) grow in the vagina. This infection puts you at risk for infections from sex (STIs). Treating this infection can lower your risk for some STIs. You should also treat this if you are pregnant. It can cause your baby to be born early. Follow these instructions at home: Medicines  Take over-the-counter and prescription medicines only as told by your doctor.  Take or use your antibiotic medicine as told by your doctor. Do not stop taking or using it even if you start to feel better. General instructions  If you your sexual partner is a woman, tell her that you have this infection. She needs to get treatment if she has symptoms. If you have a female partner, he does not need to be treated.  During treatment: ? Avoid sex. ? Do not douche. ? Avoid alcohol as told. ? Avoid breastfeeding as told.  Drink enough fluid to keep your pee (urine) clear or pale yellow.  Keep your vagina and butt (rectum) clean. ? Wash the area with warm water every day. ? Wipe from front to back after you use the toilet.  Keep all follow-up visits as told by your doctor. This is important. Preventing this condition  Do not douche.  Use only warm water to wash around your vagina.  Use protection when you have sex. This includes: ? Latex condoms. ? Dental dams.  Limit how many people you have sex with. It is best to only have sex with the same person (be monogamous).  Get tested for STIs. Have your partner get tested.  Wear underwear that is cotton or lined with cotton.  Avoid tight pants and pantyhose. This is most important in summer.  Do not use any products that have nicotine or tobacco in them. These include cigarettes and e-cigarettes. If you need help quitting, ask your doctor.  Do not use illegal drugs.  Limit how much alcohol you drink. Contact a doctor if:  Your  symptoms do not get better, even after you are treated.  You have more discharge or pain when you pee (urinate).  You have a fever.  You have pain in your belly (abdomen).  You have pain with sex.  Your bleed from your vagina between periods. Summary  This infection happens when too many germs (bacteria) grow in the vagina.  Treating this condition can lower your risk for some infections from sex (STIs).  You should also treat this if you are pregnant. It can cause early (premature) birth.  Do not stop taking or using your antibiotic medicine even if you start to feel better. This information is not intended to replace advice given to you by your health care provider. Make sure you discuss any questions you have with your health care provider. Document Released: 03/26/2008 Document Revised: 03/02/2016 Document Reviewed: 03/02/2016 Elsevier Interactive Patient Education  2019 [employer]. Endometrial Ablation Endometrial ablation is a procedure that destroys the thin inner layer of the lining of the uterus (endometrium). This procedure may be done:  To stop heavy periods.  To stop bleeding that is causing anemia.  To control irregular bleeding.  To treat bleeding caused by small tumors (fibroids) in the endometrium. This procedure is often an alternative to major surgery, such as removal of the uterus and cervix (hysterectomy). As a result of this procedure:  You may not be able to have children. However, if you are  premenopausal (you have not gone through menopause): ? You may still have a small chance of getting pregnant. ? You will need to use a reliable method of birth control after the procedure to prevent pregnancy.  You may stop having a menstrual period, or you may have only a small amount of bleeding during your period. Menstruation may return several years after the procedure. Tell a health care provider about:  Any allergies you have.  All medicines you are  taking, including vitamins, herbs, eye drops, creams, and over-the-counter medicines.  Any problems you or family members have had with the use of anesthetic medicines.  Any blood disorders you have.  Any surgeries you have had.  Any medical conditions you have. What are the risks? Generally, this is a safe procedure. However, problems may occur, including:  A hole (perforation) in the uterus or bowel.  Infection of the uterus, bladder, or vagina.  Bleeding.  Damage to other structures or organs.  An air bubble in the lung (air embolus).  Problems with pregnancy after the procedure.  Failure of the procedure.  Decreased ability to diagnose cancer in the endometrium. What happens before the procedure?  You will have tests of your endometrium to make sure there are no pre-cancerous cells or cancer cells present.  You may have an ultrasound of the uterus.  You may be given medicines to thin the endometrium.  Ask your health care provider about: ? Changing or stopping your regular medicines. This is especially important if you take diabetes medicines or blood thinners. ? Taking medicines such as aspirin and ibuprofen. These medicines can thin your blood. Do not take these medicines before your procedure if your doctor tells you not to.  Plan to have someone take you home from the hospital or clinic. What happens during the procedure?   You will lie on an exam table with your feet and legs supported as in a pelvic exam.  To lower your risk of infection: ? Your health care team will wash or sanitize their hands and put on germ-free (sterile) gloves. ? Your genital area will be washed with soap.  An IV tube will be inserted into one of your veins.  You will be given a medicine to help you relax (sedative).  A surgical instrument with a light and camera (resectoscope) will be inserted into your vagina and moved into your uterus. This allows your surgeon to see inside your  uterus.  Endometrial tissue will be removed using one of the following methods: ? Radiofrequency. This method uses a radiofrequency-alternating electric current to remove the endometrium. ? Cryotherapy. This method uses extreme cold to freeze the endometrium. ? Heated-free liquid. This method uses a heated saltwater (saline) solution to remove the endometrium. ? Microwave. This method uses high-energy microwaves to heat up the endometrium and remove it. ? Thermal balloon. This method involves inserting a catheter with a balloon tip into the uterus. The balloon tip is filled with heated fluid to remove the endometrium. The procedure may vary among health care providers and hospitals. What happens after the procedure?  Your blood pressure, heart rate, breathing rate, and blood oxygen level will be monitored until the medicines you were given have worn off.  As tissue healing occurs, you may notice vaginal bleeding for 4-6 weeks after the procedure. You may also experience: ? Cramps. ? Thin, watery vaginal discharge that is light pink or brown in color. ? A need to urinate more frequently than usual. ? Nausea.  Do not drive for 24 hours if you were given a sedative.  Do not have sex or insert anything into your vagina until your health care provider approves. Summary  Endometrial ablation is done to treat the many causes of heavy menstrual bleeding.  The procedure may be done only after medications have been tried to control the bleeding.  Plan to have someone take you home from the hospital or clinic. This information is not intended to replace advice given to you by your health care provider. Make sure you discuss any questions you have with your health care provider. Document Released: 04/26/2004 Document Revised: 12/02/2017 Document Reviewed: 07/04/2016 Elsevier Interactive Patient Education  2019 [place] Maintenance, Female Adopting a healthy lifestyle and getting  preventive care can go a long way to promote health and wellness. Talk with your health care provider about what schedule of regular examinations is right for you. This is a good chance for you to check in with your provider about disease prevention and staying healthy. In between checkups, there are plenty of things you can do on your own. Experts have done a lot of research about which lifestyle changes and preventive measures are most likely to keep you healthy. Ask your health care provider for more information. Weight and diet Eat a healthy diet  Be sure to include plenty of vegetables, fruits, low-fat dairy products, and lean protein.  Do not eat a lot of foods high in solid fats, added sugars, or salt.  Get regular exercise. This is one of the most important things you can do for your health. ? Most adults should exercise for at least 150 minutes each week. The exercise should increase your heart rate and make you sweat (moderate-intensity exercise). ? Most adults should also do strengthening exercises at least twice a week. This is in addition to the moderate-intensity exercise. Maintain a healthy weight  Body mass index (BMI) is a measurement that can be used to identify possible weight problems. It estimates body fat based on height and weight. Your health care provider can help determine your BMI and help you achieve or maintain a healthy weight.  For females 78 years of age and older: ? A BMI below 18.5 is considered underweight. ? A BMI of 18.5 to 24.9 is normal. ? A BMI of 25 to 29.9 is considered overweight. ? A BMI of 30 and above is considered obese. Watch levels of cholesterol and blood lipids  You should start having your blood tested for lipids and cholesterol at 39 years of age, then have this test every 5 years.  You may need to have your cholesterol levels checked more often if: ? Your lipid or cholesterol levels are high. ? You are older than 39 years of age. ? You  are at high risk for heart disease. Cancer screening Lung Cancer  Lung cancer screening is recommended for adults 85-36 years old who are at high risk for lung cancer because of a history of smoking.  A yearly low-dose CT scan of the lungs is recommended for people who: ? Currently smoke. ? Have quit within the past 15 years. ? Have at least a 30-pack-year history of smoking. A pack year is smoking an average of one pack of cigarettes a day for 1 year.  Yearly screening should continue until it has been 15 years since you quit.  Yearly screening should stop if you develop a health problem that would prevent you from having lung cancer treatment.  Breast Cancer  Practice breast self-awareness. This means understanding how your breasts normally appear and feel.  It also means doing regular breast self-exams. Let your health care provider know about any changes, no matter how small.  If you are in your 20s or 30s, you should have a clinical breast exam (CBE) by a health care provider every 1-3 years as part of a regular health exam.  If you are 39 or older, have a CBE every year. Also consider having a breast X-ray (mammogram) every year.  If you have a family history of breast cancer, talk to your health care provider about genetic screening.  If you are at high risk for breast cancer, talk to your health care provider about having an MRI and a mammogram every year.  Breast cancer gene (BRCA) assessment is recommended for women who have family members with BRCA-related cancers. BRCA-related cancers include: ? Breast. ? Ovarian. ? Tubal. ? Peritoneal cancers.  Results of the assessment will determine the need for genetic counseling and BRCA1 and BRCA2 testing. Cervical Cancer Your health care provider may recommend that you be screened regularly for cancer of the pelvic organs (ovaries, uterus, and vagina). This screening involves a pelvic examination, including checking for  microscopic changes to the surface of your cervix (Pap test). You may be encouraged to have this screening done every 3 years, beginning at age 36.  For women ages 52-65, health care providers may recommend pelvic exams and Pap testing every 3 years, or they may recommend the Pap and pelvic exam, combined with testing for human papilloma virus (HPV), every 5 years. Some types of HPV increase your risk of cervical cancer. Testing for HPV may also be done on women of any age with unclear Pap test results.  Other health care providers may not recommend any screening for nonpregnant women who are considered low risk for pelvic cancer and who do not have symptoms. Ask your health care provider if a screening pelvic exam is right for you.  If you have had past treatment for cervical cancer or a condition that could lead to cancer, you need Pap tests and screening for cancer for at least 20 years after your treatment. If Pap tests have been discontinued, your risk factors (such as having a new sexual partner) need to be reassessed to determine if screening should resume. Some women have medical problems that increase the chance of getting cervical cancer. In these cases, your health care provider may recommend more frequent screening and Pap tests. Colorectal Cancer  This type of cancer can be detected and often prevented.  Routine colorectal cancer screening usually begins at 39 years of age and continues through 39 years of age.  Your health care provider may recommend screening at an earlier age if you have risk factors for colon cancer.  Your health care provider may also recommend using home test kits to check for hidden blood in the stool.  A small camera at the end of a tube can be used to examine your colon directly (sigmoidoscopy or colonoscopy). This is done to check for the earliest forms of colorectal cancer.  Routine screening usually begins at age 69.  Direct examination of the colon  should be repeated every 5-10 years through 39 years of age. However, you may need to be screened more often if early forms of precancerous polyps or small growths are found. Skin Cancer  Check your skin from head to toe regularly.  Tell your health care  provider about any new moles or changes in moles, especially if there is a change in a mole's shape or color.  Also tell your health care provider if you have a mole that is larger than the size of a pencil eraser.  Always use sunscreen. Apply sunscreen liberally and repeatedly throughout the day.  Protect yourself by wearing long sleeves, pants, a wide-brimmed hat, and sunglasses whenever you are outside. Heart disease, diabetes, and high blood pressure  High blood pressure causes heart disease and increases the risk of stroke. High blood pressure is more likely to develop in: ? People who have blood pressure in the high end of the normal range (130-139/85-89 mm Hg). ? People who are overweight or obese. ? People who are African American.  If you are 52-84 years of age, have your blood pressure checked every 3-5 years. If you are 11 years of age or older, have your blood pressure checked every year. You should have your blood pressure measured twice-once when you are at a hospital or clinic, and once when you are not at a hospital or clinic. Record the average of the two measurements. To check your blood pressure when you are not at a hospital or clinic, you can use: ? An automated blood pressure machine at a pharmacy. ? A home blood pressure monitor.  If you are between 69 years and 42 years old, ask your health care provider if you should take aspirin to prevent strokes.  Have regular diabetes screenings. This involves taking a blood sample to check your fasting blood sugar level. ? If you are at a normal weight and have a low risk for diabetes, have this test once every three years after 39 years of age. ? If you are overweight and  have a high risk for diabetes, consider being tested at a younger age or more often. Preventing infection Hepatitis B  If you have a higher risk for hepatitis B, you should be screened for this virus. You are considered at high risk for hepatitis B if: ? You were born in a country where hepatitis B is common. Ask your health care provider which countries are considered high risk. ? Your parents were born in a high-risk country, and you have not been immunized against hepatitis B (hepatitis B vaccine). ? You have HIV or AIDS. ? You use needles to inject street drugs. ? You live with someone who has hepatitis B. ? You have had sex with someone who has hepatitis B. ? You get hemodialysis treatment. ? You take certain medicines for conditions, including cancer, organ transplantation, and autoimmune conditions. Hepatitis C  Blood testing is recommended for: ? Everyone born from [birth year] through 1966. ? Anyone with known risk factors for hepatitis C. Sexually transmitted infections (STIs)  You should be screened for sexually transmitted infections (STIs) including gonorrhea and chlamydia if: ? You are sexually active and are younger than 39 years of age. ? You are older than 39 years of age and your health care provider tells you that you are at risk for this type of infection. ? Your sexual activity has changed since you were last screened and you are at an increased risk for chlamydia or gonorrhea. Ask your health care provider if you are at risk.  If you do not have HIV, but are at risk, it may be recommended that you take a prescription medicine daily to prevent HIV infection. This is called pre-exposure prophylaxis (PrEP). You are considered at risk  if: ? You are sexually active and do not regularly use condoms or know the HIV status of your partner(s). ? You take drugs by injection. ? You are sexually active with a partner who has HIV. Talk with your health care provider about whether you  are at high risk of being infected with HIV. If you choose to begin PrEP, you should first be tested for HIV. You should then be tested every 3 months for as long as you are taking PrEP. Pregnancy  If you are premenopausal and you may become pregnant, ask your health care provider about preconception counseling.  If you may become pregnant, take 400 to 800 micrograms (mcg) of folic acid every day.  If you want to prevent pregnancy, talk to your health care provider about birth control (contraception). Osteoporosis and menopause  Osteoporosis is a disease in which the bones lose minerals and strength with aging. This can result in serious bone fractures. Your risk for osteoporosis can be identified using a bone density scan.  If you are 37 years of age or older, or if you are at risk for osteoporosis and fractures, ask your health care provider if you should be screened.  Ask your health care provider whether you should take a calcium or vitamin D supplement to lower your risk for osteoporosis.  Menopause may have certain physical symptoms and risks.  Hormone replacement therapy may reduce some of these symptoms and risks. Talk to your health care provider about whether hormone replacement therapy is right for you. Follow these instructions at home:  Schedule regular health, dental, and eye exams.  Stay current with your immunizations.  Do not use any tobacco products including cigarettes, chewing tobacco, or electronic cigarettes.  If you are pregnant, do not drink alcohol.  If you are breastfeeding, limit how much and how often you drink alcohol.  Limit alcohol intake to no more than 1 drink per day for nonpregnant women. One drink equals 12 ounces of beer, 5 ounces of wine, or 1 ounces of hard liquor.  Do not use street drugs.  Do not share needles.  Ask your health care provider for help if you need support or information about quitting drugs.  Tell your health care  provider if you often feel depressed.  Tell your health care provider if you have ever been abused or do not feel safe at home. This information is not intended to replace advice given to you by your health care provider. Make sure you discuss any questions you have with your health care provider. Document Released: 12/31/2010 Document Revised: 11/23/2015 Document Reviewed: 03/21/2015 Elsevier Interactive Patient Education  2019 [employer].

## 2018-09-10 ENCOUNTER — Encounter: Payer: Self-pay | Admitting: Gastroenterology

## 2018-10-02 ENCOUNTER — Telehealth: Payer: Self-pay | Admitting: Medical Oncology

## 2018-10-02 NOTE — Telephone Encounter (Signed)
Labs changed to be done at lab [facility] and then virtual visit.

## 2018-10-05 ENCOUNTER — Inpatient Hospital Stay: Payer: BC Managed Care – PPO | Admitting: Internal Medicine

## 2018-10-05 ENCOUNTER — Telehealth: Payer: Self-pay | Admitting: Medical Oncology

## 2018-10-05 ENCOUNTER — Inpatient Hospital Stay: Payer: BC Managed Care – PPO

## 2018-10-05 NOTE — Telephone Encounter (Signed)
Pt appt -I told pt to expect a call from scheduler re webex appt or phone call  so she does not have to come in .  [facility]- lvm to fax lab results to Dr Julien Nordmann.

## 2018-10-06 ENCOUNTER — Telehealth: Payer: Self-pay | Admitting: Internal Medicine

## 2018-10-06 NOTE — Telephone Encounter (Signed)
Spoke with patient re 4/9 webex visit.

## 2018-10-06 NOTE — Telephone Encounter (Signed)
Spoke with patient regarding her webex appointment. Sent join link to her email and told her to download the [employer]. Patient expressed understanding.

## 2018-10-08 ENCOUNTER — Encounter: Payer: Self-pay | Admitting: Internal Medicine

## 2018-10-08 ENCOUNTER — Inpatient Hospital Stay: Payer: BC Managed Care – PPO | Attending: Internal Medicine | Admitting: Internal Medicine

## 2018-10-08 DIAGNOSIS — D509 Iron deficiency anemia, unspecified: Secondary | ICD-10-CM | POA: Diagnosis not present

## 2018-10-08 DIAGNOSIS — K5900 Constipation, unspecified: Secondary | ICD-10-CM | POA: Insufficient documentation

## 2018-10-08 DIAGNOSIS — N920 Excessive and frequent menstruation with regular cycle: Secondary | ICD-10-CM | POA: Insufficient documentation

## 2018-10-08 NOTE — Progress Notes (Signed)
[place] [phone]   Fax:([phone]  PROGRESS NOTE FOR TELEMEDICINE VISITS  [name], NP-C [place] [address]  I connected with@ on 10/08/18 at  9:15 AM EDT by video enabled telemedicine visit and verified that I am speaking with the correct person using two identifiers.   I discussed the limitations, risks, security and privacy concerns of performing an evaluation and management service by telemedicine and the availability of in-person appointments. I also discussed with the patient that there may be a patient responsible charge related to this service. The patient expressed understanding and agreed to proceed.  Other persons participating in the visit and their role in the encounter: None  Patient's location: Home Provider's location: [place]  DIAGNOSIS: Iron deficiency anemia secondary to menorrhagia.  PRIOR THERAPY: None  CURRENT THERAPY: Integra +1 capsule p.o. daily.  INTERVAL HISTORY: Tina [name] 38 y.o. female has a virtual WebEx visit with me today regarding her condition.  The patient is feeling much better today and she has more energy.  She is tolerating her current treatment with Integra plus fairly well.  She denied having any fatigue or weakness.  She denied having any dizzy spells.  She has no chest pain, shortness of breath, cough or hemoptysis.  She has no nausea, vomiting, diarrhea but has occasional constipation.  The patient had repeat CBC, iron study and ferritin performed recently and we have the visit today for evaluation and discussion of her lab results and treatment options.  MEDICAL HISTORY: Past Medical History:  Diagnosis Date  . Abnormal Pap smear    f/u was ok  . Anemia   . Infection    UTI  . Ovarian cyst   . Vaginal Pap smear, abnormal     ALLERGIES:  is allergic to diflucan [fluconazole]; ferrous sulfate; monistat [miconazole]; and tdap  [tetanus-diphth-acell pertussis].  MEDICATIONS:  Current Outpatient Medications  Medication Sig Dispense Refill  . FeFum-FePoly-FA-B Cmp-C-Biot (INTEGRA PLUS) CAPS Take 1 capsule by mouth every morning. 30 capsule 2  . metroNIDAZOLE (FLAGYL) 500 MG tablet Take 1 tablet (500 mg total) by mouth 2 (two) times daily. 14 tablet 0   No current facility-administered medications for this visit.     SURGICAL HISTORY:  Past Surgical History:  Procedure Laterality Date  . NO PAST SURGERIES    . TUBAL LIGATION Bilateral 09/02/2013   Procedure: POST PARTUM TUBAL LIGATION;  Surgeon: Guss Bunde, MD;  Location: [facility];  Service: Gynecology;  Laterality: Bilateral;    REVIEW OF SYSTEMS:  A comprehensive review of systems was negative.   LABORATORY DATA: Lab Results  Component Value Date   WBC 4.1 08/29/2018   HGB 8.0 (L) 08/29/2018   HCT 28.1 (L) 08/29/2018   MCV 68.5 (L) 08/29/2018   PLT 300 08/29/2018      Chemistry      Component Value Date/Time   NA 137 08/29/2018 1038   NA 137 08/03/2018 1605   K 3.4 (L) 08/29/2018 1038   CL 107 08/29/2018 1038   CO2 23 08/29/2018 1038   BUN 9 08/29/2018 1038   BUN 9 08/03/2018 1605   CREATININE 0.67 08/29/2018 1038      Component Value Date/Time   CALCIUM 8.7 (L) 08/29/2018 1038   ALKPHOS 48 08/29/2018 1038   AST 17 08/29/2018 1038   ALT 13 08/29/2018 1038   BILITOT 0.4 08/29/2018 1038   BILITOT <0.2 08/03/2018 1605     CBC  and iron study performed at [facility] showed hemoglobin of 10.4, hematocrit 34.2%, total white blood count 4.9, platelets 2 88,000, total iron binding capacity 378, serum iron 32, iron saturation 8% and serum ferritin 36.  RADIOGRAPHIC STUDIES: No results found.  ASSESSMENT AND PLAN: This is a very pleasant [name] with history of iron deficiency anemia secondary to menorrhagia.  The patient is currently undergoing treatment with Integra +1 capsule p.o. daily and has been tolerating this  treatment well. Repeat CBC, iron study and ferritin showed improvement of her hemoglobin and hematocrit as well as the iron level and serum ferritin. I discussed the lab results with the patient today.  I recommended for her to continue with her current treatment with the Integra plus. I will see her back for follow-up visit in 3 months for evaluation with repeat CBC, iron study and ferritin. The patient was advised to call immediately if she has any concerning symptoms in the interval. I discussed the assessment and treatment plan with the patient. The patient was provided an opportunity to ask questions and all were answered. The patient agreed with the plan and demonstrated an understanding of the instructions.   The patient was advised to call back or seek an in-person evaluation if the symptoms worsen or if the condition fails to improve as anticipated.  I provided 15 minutes of face-to-face video visit time during this encounter, and > 50% was spent counseling as documented under my assessment & plan.  Eilleen Kempf, MD 10/08/2018 8:28 AM  Disclaimer: This note was dictated with voice recognition software. Similar sounding words can inadvertently be transcribed and may not be corrected upon review.

## 2018-10-09 ENCOUNTER — Telehealth: Payer: Self-pay | Admitting: Internal Medicine

## 2018-10-09 NOTE — Telephone Encounter (Signed)
Tried to reach regarding schedule °

## 2019-01-11 ENCOUNTER — Inpatient Hospital Stay: Payer: BC Managed Care – PPO | Attending: Internal Medicine | Admitting: Internal Medicine

## 2019-01-11 ENCOUNTER — Inpatient Hospital Stay: Payer: BC Managed Care – PPO

## 2019-04-22 ENCOUNTER — Ambulatory Visit: Payer: BC Managed Care – PPO | Admitting: Family Medicine

## 2019-04-22 ENCOUNTER — Other Ambulatory Visit: Payer: Self-pay

## 2019-04-22 ENCOUNTER — Encounter: Payer: Self-pay | Admitting: Family Medicine

## 2019-04-22 VITALS — BP 110/70 | HR 75 | Temp 97.7°F | Wt 158.4 lb

## 2019-04-22 DIAGNOSIS — R300 Dysuria: Secondary | ICD-10-CM

## 2019-04-22 DIAGNOSIS — Z113 Encounter for screening for infections with a predominantly sexual mode of transmission: Secondary | ICD-10-CM

## 2019-04-22 DIAGNOSIS — N949 Unspecified condition associated with female genital organs and menstrual cycle: Secondary | ICD-10-CM

## 2019-04-22 DIAGNOSIS — N898 Other specified noninflammatory disorders of vagina: Secondary | ICD-10-CM | POA: Diagnosis not present

## 2019-04-22 LAB — POCT URINALYSIS DIP (PROADVANTAGE DEVICE)
Bilirubin, UA: NEGATIVE
Blood, UA: NEGATIVE
Glucose, UA: NEGATIVE mg/dL
Ketones, POC UA: NEGATIVE mg/dL
Leukocytes, UA: NEGATIVE
Nitrite, UA: NEGATIVE
Protein Ur, POC: NEGATIVE mg/dL
Specific Gravity, Urine: 1.025
Urobilinogen, Ur: NEGATIVE
pH, UA: 5.5 (ref 5.0–8.0)

## 2019-04-22 NOTE — Progress Notes (Signed)
   Subjective:    Patient ID: Tina [name], female    DOB: 10/03/1980, 38 y.o.   MRN: [id number]  HPI Chief Complaint  Patient presents with  . vaginal irritation    vaginal irriatation. when urinating, when it hits the side it burns. no discharge, some itching   Complains of vaginal irritation and intermittent itching. No abnormal discharge or odor.   She also notes some external "stingy" when she urinates. No urinary frequency, urgency. Reports wearing a new panty liner recently.   She is married but would like to have STD testing. Denies any new sexual partners.   Denies fever, chills, shortness of breath, abdominal pain, back pain, N/V/D.    LMP: last week    Review of Systems Pertinent positives and negatives in the history of present illness.     Objective:   Physical Exam Exam conducted with a chaperone present.  Constitutional:      Appearance: Normal appearance. She is not ill-appearing.  Pulmonary:     Effort: Pulmonary effort is normal.  Abdominal:     General: Abdomen is flat. There is no distension.  Genitourinary:    Labia:        Right: Lesion present.      Vagina: No vaginal discharge or tenderness.       Comments: Right labia with a raised slightly erythematous lesion, tender to palpation. No surrounding erythema, induration or drainage.  Skin:    General: Skin is warm and dry.  Neurological:     Mental Status: She is alert.    BP 110/70   Pulse 75   Temp 97.7 F (36.5 C)   Wt 158 lb 6.4 oz (71.8 kg)   BMI 29.44 kg/m       Assessment & Plan:  Vaginal irritation - Plan: POCT Urinalysis DIP (Proadvantage Device), RPR, HIV Antibody (routine testing w rflx), NuSwab Vaginitis Plus (VG+), HSV(herpes simplex vrs) 1+2 ab-IgG, HSV(herpes simplex vrs) 1+2 ab-IgM  Vagina itching  Screen for STD (sexually transmitted disease) - Plan: RPR, HIV Antibody (routine testing w rflx), NuSwab Vaginitis Plus (VG+)  Burning with urination - Plan:  POCT Urinalysis DIP (Proadvantage Device)  Female genital lesion - Plan: HSV(herpes simplex vrs) 1+2 ab-IgG, HSV(herpes simplex vrs) 1+2 ab-IgM  UA negative.  STD testing done including HIV, RPR, GC/CT and trich Discussed that the genital lesion is not classic for HSV and may be a contact dermatitis from the new panty liners but I will check HSV in her labs.  Avoid sexual activity until results are back.  Follow up if lesion becomes open or she notices drainage for a viral swab.  Follow up pending labs.  Advised her to call and schedule with Dr. Earlie Server for IDA and treatment since she is overdue.

## 2019-04-22 NOTE — Patient Instructions (Signed)
Avoid sexual activity until results are back.

## 2019-04-23 LAB — RPR: RPR Ser Ql: NONREACTIVE

## 2019-04-23 LAB — HIV ANTIBODY (ROUTINE TESTING W REFLEX): HIV Screen 4th Generation wRfx: NONREACTIVE

## 2019-04-24 LAB — HSV(HERPES SIMPLEX VRS) I + II AB-IGG
HSV 1 Glycoprotein G Ab, IgG: <0.91 index (ref 0.00–0.90)
HSV 2 IgG, Type Spec: 12 index — ABNORMAL HIGH (ref 0.00–0.90)

## 2019-04-24 LAB — HSV(HERPES SIMPLEX VRS) I + II AB-IGM: HSVI/II Comb IgM: <0.91 Ratio (ref 0.00–0.90)

## 2019-04-25 LAB — NUSWAB VAGINITIS PLUS (VG+)
Atopobium vaginae: HIGH Score — AB
BVAB 2: HIGH Score — AB
Candida albicans, NAA: NEGATIVE
Candida glabrata, NAA: NEGATIVE
Chlamydia trachomatis, NAA: NEGATIVE
Megasphaera 1: HIGH Score — AB
Neisseria gonorrhoeae, NAA: NEGATIVE
Trich vag by NAA: NEGATIVE

## 2019-04-26 ENCOUNTER — Other Ambulatory Visit: Payer: Self-pay | Admitting: Family Medicine

## 2019-04-26 ENCOUNTER — Encounter: Payer: Self-pay | Admitting: Family Medicine

## 2019-04-26 DIAGNOSIS — B9689 Other specified bacterial agents as the cause of diseases classified elsewhere: Secondary | ICD-10-CM

## 2019-04-26 DIAGNOSIS — N760 Acute vaginitis: Secondary | ICD-10-CM

## 2019-04-26 MED ORDER — METRONIDAZOLE 500 MG PO TABS: 500.0000 mg | ORAL_TABLET | Freq: Two times a day (BID) | ORAL | 0 refills | Status: DC

## 2019-04-28 ENCOUNTER — Encounter: Payer: Self-pay | Admitting: Family Medicine

## 2019-06-03 ENCOUNTER — Telehealth: Payer: Self-pay | Admitting: Internal Medicine

## 2019-06-03 NOTE — Telephone Encounter (Signed)
Returned patient's phone call regarding rescheduling 07/13 missed appointment, per patient's request appointment has moved to 12/08.

## 2019-06-08 ENCOUNTER — Encounter: Payer: Self-pay | Admitting: Internal Medicine

## 2019-06-08 ENCOUNTER — Inpatient Hospital Stay (HOSPITAL_BASED_OUTPATIENT_CLINIC_OR_DEPARTMENT_OTHER): Payer: BC Managed Care – PPO | Admitting: Internal Medicine

## 2019-06-08 ENCOUNTER — Other Ambulatory Visit: Payer: Self-pay

## 2019-06-08 ENCOUNTER — Telehealth: Payer: Self-pay | Admitting: *Deleted

## 2019-06-08 ENCOUNTER — Inpatient Hospital Stay: Payer: BC Managed Care – PPO | Attending: Internal Medicine

## 2019-06-08 ENCOUNTER — Telehealth: Payer: Self-pay | Admitting: Internal Medicine

## 2019-06-08 ENCOUNTER — Other Ambulatory Visit: Payer: Self-pay | Admitting: Internal Medicine

## 2019-06-08 VITALS — BP 115/91 | HR 82 | Temp 98.0°F | Resp 17 | Wt 164.2 lb

## 2019-06-08 DIAGNOSIS — N920 Excessive and frequent menstruation with regular cycle: Secondary | ICD-10-CM | POA: Insufficient documentation

## 2019-06-08 DIAGNOSIS — D509 Iron deficiency anemia, unspecified: Secondary | ICD-10-CM | POA: Diagnosis not present

## 2019-06-08 DIAGNOSIS — D500 Iron deficiency anemia secondary to blood loss (chronic): Secondary | ICD-10-CM | POA: Diagnosis present

## 2019-06-08 DIAGNOSIS — Z79899 Other long term (current) drug therapy: Secondary | ICD-10-CM | POA: Diagnosis not present

## 2019-06-08 LAB — CBC WITH DIFFERENTIAL (CANCER CENTER ONLY)
Abs Immature Granulocytes: 0.01 10*3/uL (ref 0.00–0.07)
Basophils Absolute: 0 10*3/uL (ref 0.0–0.1)
Basophils Relative: 1 %
Eosinophils Absolute: 0 10*3/uL (ref 0.0–0.5)
Eosinophils Relative: 1 %
HCT: 32 % — ABNORMAL LOW (ref 36.0–46.0)
Hemoglobin: 10 g/dL — ABNORMAL LOW (ref 12.0–15.0)
Immature Granulocytes: 0 %
Lymphocytes Relative: 42 %
Lymphs Abs: 2.3 10*3/uL (ref 0.7–4.0)
MCH: 26.1 pg (ref 26.0–34.0)
MCHC: 31.3 g/dL (ref 30.0–36.0)
MCV: 83.6 fL (ref 80.0–100.0)
Monocytes Absolute: 0.5 10*3/uL (ref 0.1–1.0)
Monocytes Relative: 8 %
Neutro Abs: 2.7 10*3/uL (ref 1.7–7.7)
Neutrophils Relative %: 48 %
Platelet Count: 308 10*3/uL (ref 150–400)
RBC: 3.83 MIL/uL — ABNORMAL LOW (ref 3.87–5.11)
RDW: 13.7 % (ref 11.5–15.5)
WBC Count: 5.5 10*3/uL (ref 4.0–10.5)
nRBC: 0 % (ref 0.0–0.2)

## 2019-06-08 LAB — IRON AND TIBC
Iron: 31 ug/dL — ABNORMAL LOW (ref 41–142)
Saturation Ratios: 7 % — ABNORMAL LOW (ref 21–57)
TIBC: 411 ug/dL (ref 236–444)
UIBC: 381 ug/dL (ref 120–384)

## 2019-06-08 LAB — FERRITIN: Ferritin: 6 ng/mL — ABNORMAL LOW (ref 11–307)

## 2019-06-08 NOTE — Progress Notes (Signed)
[place] Telephone:([phone]   Fax:(336) [phone]  OFFICE PROGRESS NOTE  [name], NP-C [place] [address]  DIAGNOSIS: Iron deficiency anemia secondary to menorrhagia.  PRIOR THERAPY: None  CURRENT THERAPY: Integra +1 capsule p.o. daily.  INTERVAL HISTORY: Tina [name] 38 y.o. female returns to the clinic today for follow-up visit.  The patient is feeling fine today with no concerning complaints.  She does not have any more craving to ice or salt as before.  She is having more energy.  She denied having any current chest pain, shortness of breath, cough or hemoptysis.  She denied having any dizzy spells.  She has no current bleeding issues.  The patient has no nausea, vomiting, diarrhea or constipation.  She has been tolerating her treatment with Integra plus fairly well.  She is here today for evaluation and repeat blood work including CBC, iron study and ferritin.  MEDICAL HISTORY: Past Medical History:  Diagnosis Date  . Abnormal Pap smear    f/u was ok  . Anemia   . Infection    UTI  . Ovarian cyst   . Vaginal Pap smear, abnormal     ALLERGIES:  is allergic to diflucan [fluconazole]; ferrous sulfate; monistat [miconazole]; and tdap [tetanus-diphth-acell pertussis].  MEDICATIONS:  Current Outpatient Medications  Medication Sig Dispense Refill  . FeFum-FePoly-FA-B Cmp-C-Biot (INTEGRA PLUS) CAPS Take 1 capsule by mouth every morning. (Patient not taking: Reported on 04/22/2019) 30 capsule 2  . metroNIDAZOLE (FLAGYL) 500 MG tablet Take 1 tablet (500 mg total) by mouth 2 (two) times daily. 14 tablet 0   No current facility-administered medications for this visit.     SURGICAL HISTORY:  Past Surgical History:  Procedure Laterality Date  . NO PAST SURGERIES    . TUBAL LIGATION Bilateral 09/02/2013   Procedure: POST PARTUM TUBAL LIGATION;  Surgeon: Guss Bunde, MD;  Location: [facility];  Service: Gynecology;   Laterality: Bilateral;    REVIEW OF SYSTEMS:  A comprehensive review of systems was negative except for: Constitutional: positive for fatigue   PHYSICAL EXAMINATION: General appearance: alert, cooperative and no distress Head: Normocephalic, without obvious abnormality, atraumatic Neck: no adenopathy, no JVD, supple, symmetrical, trachea midline and thyroid not enlarged, symmetric, no tenderness/mass/nodules Lymph nodes: Cervical, supraclavicular, and axillary nodes normal. Resp: clear to auscultation bilaterally Back: symmetric, no curvature. ROM normal. No CVA tenderness. Cardio: regular rate and rhythm, S1, S2 normal, no murmur, click, rub or gallop GI: soft, non-tender; bowel sounds normal; no masses,  no organomegaly Extremities: extremities normal, atraumatic, no cyanosis or edema  ECOG PERFORMANCE STATUS: 1 - Symptomatic but completely ambulatory  Blood pressure (!) 115/91, pulse 82, temperature 98 F (36.7 C), temperature source Oral, resp. rate 17, weight 164 lb 3.2 oz (74.5 kg), SpO2 100 %.  LABORATORY DATA: Lab Results  Component Value Date   WBC 5.5 06/08/2019   HGB 10.0 (L) 06/08/2019   HCT 32.0 (L) 06/08/2019   MCV 83.6 06/08/2019   PLT 308 06/08/2019      Chemistry      Component Value Date/Time   NA 137 08/29/2018 1038   NA 137 08/03/2018 1605   K 3.4 (L) 08/29/2018 1038   CL 107 08/29/2018 1038   CO2 23 08/29/2018 1038   BUN 9 08/29/2018 1038   BUN 9 08/03/2018 1605   CREATININE 0.67 08/29/2018 1038      Component Value Date/Time   CALCIUM 8.7 (L) 08/29/2018  1038   ALKPHOS 48 08/29/2018 1038   AST 17 08/29/2018 1038   ALT 13 08/29/2018 1038   BILITOT 0.4 08/29/2018 1038   BILITOT <0.2 08/03/2018 1605       RADIOGRAPHIC STUDIES: No results found.  ASSESSMENT AND PLAN:  This is a very pleasant [name] with history of iron deficiency anemia secondary to menorrhagia.  The patient is currently undergoing treatment with  Integra +1 capsule p.o. daily and has been tolerating this treatment well. The patient is doing much better today with no concerning complaints. CBC today showed improvement of her hemoglobin and hematocrit but the patient continues to have mild anemia. Iron study and ferritin are still pending. I recommended for the patient to continue her treatment with Integra +1 capsule p.o. daily. If the iron study and ferritin are still low, we may consider the patient for intravenous iron infusion with Venofer weekly for 4 weeks. I will see her back for follow-up visit in 6 months for evaluation with repeat CBC, iron study and ferritin. She was advised to call immediately if she has any concerning symptoms in the interval. The patient voices understanding of current disease status and treatment options and is in agreement with the current care plan.  All questions were answered. The patient knows to call the clinic with any problems, questions or concerns. We can certainly see the patient much sooner if necessary.  I spent 10 minutes counseling the patient face to face. The total time spent in the appointment was 15 minutes.  Disclaimer: This note was dictated with voice recognition software. Similar sounding words can inadvertently be transcribed and may not be corrected upon review.

## 2019-06-08 NOTE — Telephone Encounter (Signed)
Scheduled appt per 12/8 los - pt is aware of appt - per pt no print out needed , pt is my chart active

## 2019-06-08 NOTE — Telephone Encounter (Signed)
-----   Message from Curt Bears, MD sent at 06/08/2019 11:40 AM EST ----- She needs iron infusion over the next few weeks. I will order it every Friday for the next 4 weeks. ----- Message ----- From: Buel Ream, Lab [facility] [place] Sent: 06/08/2019  10:12 AM EST To: Curt Bears, MD

## 2019-06-08 NOTE — Telephone Encounter (Signed)
Spoke with pt and informed pt of iron results and instructions from Dr. Julien Nordmann below.  Pt voiced understanding. Schedule message sent.

## 2019-06-09 ENCOUNTER — Telehealth: Payer: Self-pay | Admitting: Internal Medicine

## 2019-06-09 NOTE — Telephone Encounter (Signed)
scheduled appt per 12/8 sch message - pt aware of appts

## 2019-06-11 ENCOUNTER — Inpatient Hospital Stay: Payer: BC Managed Care – PPO

## 2019-06-11 ENCOUNTER — Inpatient Hospital Stay (HOSPITAL_BASED_OUTPATIENT_CLINIC_OR_DEPARTMENT_OTHER): Payer: BC Managed Care – PPO | Admitting: Medical

## 2019-06-11 ENCOUNTER — Other Ambulatory Visit: Payer: Self-pay

## 2019-06-11 VITALS — BP 125/74 | HR 67 | Temp 98.3°F | Resp 18

## 2019-06-11 DIAGNOSIS — D500 Iron deficiency anemia secondary to blood loss (chronic): Secondary | ICD-10-CM | POA: Diagnosis not present

## 2019-06-11 DIAGNOSIS — D508 Other iron deficiency anemias: Secondary | ICD-10-CM

## 2019-06-11 DIAGNOSIS — D509 Iron deficiency anemia, unspecified: Secondary | ICD-10-CM

## 2019-06-11 MED ORDER — ACETAMINOPHEN 325 MG PO TABS
ORAL_TABLET | ORAL | Status: AC
  Filled 2019-06-11: qty 2

## 2019-06-11 MED ORDER — DIPHENHYDRAMINE HCL 50 MG/ML IJ SOLN
50.0000 mg | Freq: Once | INTRAMUSCULAR | Status: AC
  Administered 2019-06-11: 25 mg via INTRAVENOUS

## 2019-06-11 MED ORDER — METHYLPREDNISOLONE SODIUM SUCC 125 MG IJ SOLR
125.0000 mg | Freq: Once | INTRAMUSCULAR | Status: AC
  Administered 2019-06-11: 125 mg via INTRAVENOUS

## 2019-06-11 MED ORDER — METHYLPREDNISOLONE SODIUM SUCC 125 MG IJ SOLR
INTRAMUSCULAR | Status: AC
  Filled 2019-06-11: qty 2

## 2019-06-11 MED ORDER — SODIUM CHLORIDE 0.9 % IV SOLN
125.0000 mg | Freq: Once | INTRAVENOUS | Status: AC
  Administered 2019-06-11: 09:00:00 125 mg via INTRAVENOUS
  Filled 2019-06-11: qty 10

## 2019-06-11 MED ORDER — FAMOTIDINE IN NACL 20-0.9 MG/50ML-% IV SOLN
INTRAVENOUS | Status: AC
  Filled 2019-06-11: qty 50

## 2019-06-11 MED ORDER — FAMOTIDINE IN NACL 20-0.9 MG/50ML-% IV SOLN
20.0000 mg | Freq: Once | INTRAVENOUS | Status: AC | PRN
  Administered 2019-06-11: 10:00:00 20 mg via INTRAVENOUS

## 2019-06-11 MED ORDER — DIPHENHYDRAMINE HCL 50 MG/ML IJ SOLN
INTRAMUSCULAR | Status: AC
  Filled 2019-06-11: qty 1

## 2019-06-11 MED ORDER — SODIUM CHLORIDE 0.9 % IV SOLN
Freq: Once | INTRAVENOUS | Status: AC
  Administered 2019-06-11: 08:00:00 via INTRAVENOUS
  Filled 2019-06-11: qty 250

## 2019-06-11 MED ORDER — ACETAMINOPHEN 325 MG PO TABS
650.0000 mg | ORAL_TABLET | Freq: Once | ORAL | Status: AC
  Administered 2019-06-11: 08:00:00 650 mg via ORAL

## 2019-06-11 NOTE — Patient Instructions (Signed)
Sodium Ferric Gluconate Complex injection What is this medicine? SODIUM FERRIC GLUCONATE COMPLEX (SOE dee um FER ik GLOO koe nate KOM pleks) is an iron replacement. It is used with epoetin therapy to treat low iron levels in patients who are receiving hemodialysis. This medicine may be used for other purposes; ask your health care provider or pharmacist if you have questions. COMMON BRAND NAME(S): Ferrlecit, Nulecit What should I tell my health care provider before I take this medicine? They need to know if you have any of the following conditions:  anemia that is not from iron deficiency  high levels of iron in the body  an unusual or allergic reaction to iron, benzyl alcohol, other medicines, foods, dyes, or preservatives  pregnant or are trying to become pregnant  breast-feeding How should I use this medicine? This medicine is for infusion into a vein. It is given by a health care professional in a hospital or clinic setting. Talk to your pediatrician regarding the use of this medicine in children. While this drug may be prescribed for children as young as 6 years old for selected conditions, precautions do apply. Overdosage: If you think you have taken too much of this medicine contact a poison control center or emergency room at once. NOTE: This medicine is only for you. Do not share this medicine with others. What if I miss a dose? It is important not to miss your dose. Call your doctor or health care professional if you are unable to keep an appointment. What may interact with this medicine? Do not take this medicine with any of the following medications:  deferoxamine  dimercaprol  other iron products This medicine may also interact with the following medications:  chloramphenicol  deferasirox  medicine for blood pressure like enalapril This list may not describe all possible interactions. Give your health care provider a list of all the medicines, herbs,  non-prescription drugs, or dietary supplements you use. Also tell them if you smoke, drink alcohol, or use illegal drugs. Some items may interact with your medicine. What should I watch for while using this medicine? Your condition will be monitored carefully while you are receiving this medicine. Visit your doctor for check-ups as directed. What side effects may I notice from receiving this medicine? Side effects that you should report to your doctor or health care professional as soon as possible:  allergic reactions like skin rash, itching or hives, swelling of the face, lips, or tongue  breathing problems  changes in hearing  changes in vision  chills, flushing, or sweating  fast, irregular heartbeat  feeling faint or lightheaded, falls  fever, flu-like symptoms  high or low blood pressure  pain, tingling, numbness in the hands or feet  severe pain in the chest, back, flanks, or groin  swelling of the ankles, feet, hands  trouble passing urine or change in the amount of urine  unusually weak or tired Side effects that usually do not require medical attention (report to your doctor or health care professional if they continue or are bothersome):  cramps  dark colored stools  diarrhea  headache  nausea, vomiting  stomach upset This list may not describe all possible side effects. Call your doctor for medical advice about side effects. You may report side effects to FDA at 1-800-FDA-1088. Where should I keep my medicine? This drug is given in a hospital or clinic and will not be stored at home. NOTE: This sheet is a summary. It may not cover all   possible information. If you have questions about this medicine, talk to your doctor, pharmacist, or health care provider.  2020 Elsevier/Gold Standard (2008-02-17 15:58:57)  

## 2019-06-11 NOTE — Progress Notes (Signed)
The patient was seen in the infusion room after she had received IV iron today.  She reported having hot flashes and tingling in her hands.  She had been premedicated with Solu-Medrol prior to getting her infusion as she had a reaction approximately 8 years ago when she had been treated with IV iron.  She was given Pepcid 20 mg IV with approximately one half of the dose administered prior to noting the patient's right hand was swelling.  It was noted that she had pulled her sleeve up above her IV insertion site and that her sleeve was tight and suspected to be acting as a tourniquet.  Her IV was removed and she was given a hot pack for her hand.  Her symptoms improved and she was ultimately discharged home.  Sandi Mealy, MHS, PA-C Physician Assistant

## 2019-06-11 NOTE — Progress Notes (Signed)
After treatment today, pt. CO lower back pain. Sandi Mealy assessed, ordered Pepcid. Before pepcid was complete, pt. CO of R hand swelling and tightness. R AC IV was removed, hand was eleveted with hot pack. IV was NOT infiltrated.

## 2019-06-16 ENCOUNTER — Telehealth: Payer: Self-pay | Admitting: Medical Oncology

## 2019-06-16 NOTE — Telephone Encounter (Signed)
Apprehensive about receiving Ferrlicit this Friday.Per Julien Nordmann I told pt that he is planning to give her ferrlicet again with added premeds. She is very apprehensive about taking it again due to REACTION . Marland Kitchen She said at the end of the last ferrlicit she started experiencing back pain, headache  nausea and swelling in her hands and feet. I told her to keep appt.

## 2019-06-18 ENCOUNTER — Other Ambulatory Visit: Payer: Self-pay | Admitting: Internal Medicine

## 2019-06-18 ENCOUNTER — Other Ambulatory Visit: Payer: Self-pay

## 2019-06-18 ENCOUNTER — Inpatient Hospital Stay: Payer: BC Managed Care – PPO

## 2019-06-18 VITALS — BP 124/81 | HR 83 | Temp 98.1°F | Resp 18

## 2019-06-18 DIAGNOSIS — D509 Iron deficiency anemia, unspecified: Secondary | ICD-10-CM

## 2019-06-18 DIAGNOSIS — D500 Iron deficiency anemia secondary to blood loss (chronic): Secondary | ICD-10-CM | POA: Diagnosis not present

## 2019-06-18 MED ORDER — SODIUM CHLORIDE 0.9 % IV SOLN
Freq: Once | INTRAVENOUS | Status: DC
  Filled 2019-06-18: qty 250

## 2019-06-18 MED ORDER — ACETAMINOPHEN 325 MG PO TABS
ORAL_TABLET | ORAL | Status: AC
  Filled 2019-06-18: qty 2

## 2019-06-18 MED ORDER — FAMOTIDINE IN NACL 20-0.9 MG/50ML-% IV SOLN
INTRAVENOUS | Status: AC
  Filled 2019-06-18: qty 50

## 2019-06-18 MED ORDER — HEPARIN SOD (PORK) LOCK FLUSH 100 UNIT/ML IV SOLN
500.0000 [IU] | Freq: Once | INTRAVENOUS | Status: DC | PRN
  Filled 2019-06-18: qty 5

## 2019-06-18 MED ORDER — SODIUM CHLORIDE 0.9% FLUSH
10.0000 mL | Freq: Once | INTRAVENOUS | Status: DC | PRN
  Filled 2019-06-18: qty 10

## 2019-06-18 MED ORDER — SODIUM CHLORIDE 0.9% FLUSH
3.0000 mL | Freq: Once | INTRAVENOUS | Status: DC | PRN
  Filled 2019-06-18: qty 10

## 2019-06-18 MED ORDER — SODIUM CHLORIDE 0.9 % IV SOLN
510.0000 mg | Freq: Once | INTRAVENOUS | Status: AC
  Administered 2019-06-18: 510 mg via INTRAVENOUS
  Filled 2019-06-18: qty 510

## 2019-06-18 MED ORDER — METHYLPREDNISOLONE SODIUM SUCC 125 MG IJ SOLR
125.0000 mg | Freq: Once | INTRAMUSCULAR | Status: AC
  Administered 2019-06-18: 125 mg via INTRAVENOUS

## 2019-06-18 MED ORDER — SODIUM CHLORIDE 0.9 % IV SOLN
Freq: Once | INTRAVENOUS | Status: AC
  Filled 2019-06-18: qty 250

## 2019-06-18 MED ORDER — ACETAMINOPHEN 325 MG PO TABS
650.0000 mg | ORAL_TABLET | Freq: Once | ORAL | Status: AC
  Administered 2019-06-18: 650 mg via ORAL

## 2019-06-18 MED ORDER — DIPHENHYDRAMINE HCL 50 MG/ML IJ SOLN
INTRAMUSCULAR | Status: AC
  Filled 2019-06-18: qty 1

## 2019-06-18 MED ORDER — ALTEPLASE 2 MG IJ SOLR
2.0000 mg | Freq: Once | INTRAMUSCULAR | Status: DC | PRN
  Filled 2019-06-18: qty 2

## 2019-06-18 MED ORDER — DIPHENHYDRAMINE HCL 50 MG/ML IJ SOLN: 25.0000 mg | Freq: Once | INTRAMUSCULAR | Status: DC

## 2019-06-18 MED ORDER — DIPHENHYDRAMINE HCL 50 MG/ML IJ SOLN
25.0000 mg | Freq: Once | INTRAMUSCULAR | Status: AC
  Administered 2019-06-18: 25 mg via INTRAVENOUS

## 2019-06-18 MED ORDER — HEPARIN SOD (PORK) LOCK FLUSH 100 UNIT/ML IV SOLN
250.0000 [IU] | Freq: Once | INTRAVENOUS | Status: DC | PRN
  Filled 2019-06-18: qty 5

## 2019-06-18 MED ORDER — METHYLPREDNISOLONE SODIUM SUCC 125 MG IJ SOLR
INTRAMUSCULAR | Status: AC
  Filled 2019-06-18: qty 2

## 2019-06-18 MED ORDER — METHYLPREDNISOLONE SODIUM SUCC 125 MG IJ SOLR: 125.0000 mg | Freq: Once | INTRAMUSCULAR | Status: DC

## 2019-06-18 MED ORDER — ACETAMINOPHEN 325 MG PO TABS: 650.0000 mg | ORAL_TABLET | Freq: Once | ORAL | Status: DC

## 2019-06-18 MED ORDER — FAMOTIDINE IN NACL 20-0.9 MG/50ML-% IV SOLN
20.0000 mg | Freq: Once | INTRAVENOUS | Status: AC
  Administered 2019-06-18: 20 mg via INTRAVENOUS

## 2019-06-18 NOTE — Patient Instructions (Signed)

## 2019-06-18 NOTE — Progress Notes (Signed)
Per Dr. Julien Nordmann, okay to decrease dose of Benadryl to 25mg . No added premeds for today's infusion.

## 2019-06-18 NOTE — Progress Notes (Signed)
Patient had hot flashes with tingling in hands with first dose of ferric gluconate (Nulecit) on 06/11/19. Per Dr. Julien Nordmann, will change patient to ferumoxytol (Feraheme) 510 mg IV x 2 doses starting 06/18/19. Premedications will include diphenhydramine 25 mg IV, methylprednisolone 125 mg IV , APAP 650 mg PO, and famotidine 20 mg IV.  Orders updated to reflect changes.  Leron Croak, PharmD, BCPS PGY2 Hematology/Oncology Pharmacy Resident 06/18/2019 9:11 AM

## 2019-06-24 ENCOUNTER — Inpatient Hospital Stay: Payer: BC Managed Care – PPO

## 2019-07-01 ENCOUNTER — Other Ambulatory Visit: Payer: Self-pay

## 2019-07-01 ENCOUNTER — Inpatient Hospital Stay: Payer: BC Managed Care – PPO

## 2019-07-01 VITALS — BP 119/79 | HR 73 | Temp 98.7°F | Resp 17

## 2019-07-01 DIAGNOSIS — D509 Iron deficiency anemia, unspecified: Secondary | ICD-10-CM

## 2019-07-01 DIAGNOSIS — D500 Iron deficiency anemia secondary to blood loss (chronic): Secondary | ICD-10-CM | POA: Diagnosis not present

## 2019-07-01 MED ORDER — SODIUM CHLORIDE 0.9 % IV SOLN
Freq: Once | INTRAVENOUS | Status: AC
  Filled 2019-07-01: qty 250

## 2019-07-01 MED ORDER — FAMOTIDINE IN NACL 20-0.9 MG/50ML-% IV SOLN
20.0000 mg | Freq: Once | INTRAVENOUS | Status: AC
  Administered 2019-07-01: 20 mg via INTRAVENOUS

## 2019-07-01 MED ORDER — SODIUM CHLORIDE 0.9 % IV SOLN
510.0000 mg | Freq: Once | INTRAVENOUS | Status: AC
  Administered 2019-07-01: 510 mg via INTRAVENOUS
  Filled 2019-07-01: qty 510

## 2019-07-01 MED ORDER — ACETAMINOPHEN 325 MG PO TABS
650.0000 mg | ORAL_TABLET | Freq: Once | ORAL | Status: AC
  Administered 2019-07-01: 650 mg via ORAL

## 2019-07-01 MED ORDER — DIPHENHYDRAMINE HCL 50 MG/ML IJ SOLN
INTRAMUSCULAR | Status: AC
  Filled 2019-07-01: qty 1

## 2019-07-01 MED ORDER — ACETAMINOPHEN 325 MG PO TABS
ORAL_TABLET | ORAL | Status: AC
  Filled 2019-07-01: qty 2

## 2019-07-01 MED ORDER — METHYLPREDNISOLONE SODIUM SUCC 125 MG IJ SOLR
125.0000 mg | Freq: Once | INTRAMUSCULAR | Status: AC
  Administered 2019-07-01: 125 mg via INTRAVENOUS

## 2019-07-01 MED ORDER — FAMOTIDINE IN NACL 20-0.9 MG/50ML-% IV SOLN
INTRAVENOUS | Status: AC
  Filled 2019-07-01: qty 50

## 2019-07-01 MED ORDER — METHYLPREDNISOLONE SODIUM SUCC 125 MG IJ SOLR
INTRAMUSCULAR | Status: AC
  Filled 2019-07-01: qty 2

## 2019-07-01 MED ORDER — DIPHENHYDRAMINE HCL 50 MG/ML IJ SOLN
25.0000 mg | Freq: Once | INTRAMUSCULAR | Status: AC
  Administered 2019-07-01: 25 mg via INTRAVENOUS

## 2019-07-01 NOTE — Patient Instructions (Signed)
Ferumoxytol injection What is this medicine? FERUMOXYTOL is an iron complex. Iron is used to make healthy red blood cells, which carry oxygen and nutrients throughout the body. This medicine is used to treat iron deficiency anemia. This medicine may be used for other purposes; ask your health care provider or pharmacist if you have questions. COMMON BRAND NAME(S): Feraheme What should I tell my health care provider before I take this medicine? They need to know if you have any of these conditions:  anemia not caused by low iron levels  high levels of iron in the blood  magnetic resonance imaging (MRI) test scheduled  an unusual or allergic reaction to iron, other medicines, foods, dyes, or preservatives  pregnant or trying to get pregnant  breast-feeding How should I use this medicine? This medicine is for injection into a vein. It is given by a health care professional in a hospital or clinic setting. Talk to your pediatrician regarding the use of this medicine in children. Special care may be needed. Overdosage: If you think you have taken too much of this medicine contact a poison control center or emergency room at once. NOTE: This medicine is only for you. Do not share this medicine with others. What if I miss a dose? It is important not to miss your dose. Call your doctor or health care professional if you are unable to keep an appointment. What may interact with this medicine? This medicine may interact with the following medications:  other iron products This list may not describe all possible interactions. Give your health care provider a list of all the medicines, herbs, non-prescription drugs, or dietary supplements you use. Also tell them if you smoke, drink alcohol, or use illegal drugs. Some items may interact with your medicine. What should I watch for while using this medicine? Visit your doctor or healthcare professional regularly. Tell your doctor or healthcare  professional if your symptoms do not start to get better or if they get worse. You may need blood work done while you are taking this medicine. You may need to follow a special diet. Talk to your doctor. Foods that contain iron include: whole grains/cereals, dried fruits, beans, or peas, leafy green vegetables, and organ meats (liver, kidney). What side effects may I notice from receiving this medicine? Side effects that you should report to your doctor or health care professional as soon as possible:  allergic reactions like skin rash, itching or hives, swelling of the face, lips, or tongue  breathing problems  changes in blood pressure  feeling faint or lightheaded, falls  fever or chills  flushing, sweating, or hot feelings  swelling of the ankles or feet Side effects that usually do not require medical attention (report to your doctor or health care professional if they continue or are bothersome):  diarrhea  headache  nausea, vomiting  stomach pain This list may not describe all possible side effects. Call your doctor for medical advice about side effects. You may report side effects to FDA at 1-800-FDA-1088. Where should I keep my medicine? This drug is given in a hospital or clinic and will not be stored at home. NOTE: This sheet is a summary. It may not cover all possible information. If you have questions about this medicine, talk to your doctor, pharmacist, or health care provider.  2020 Elsevier/Gold Standard (2016-08-05 20:21:10) Coronavirus (COVID-19) Are you at risk?  Are you at risk for the Coronavirus (COVID-19)?  To be considered HIGH RISK for Coronavirus (COVID-19),   you have to meet the following criteria:  . Traveled to [place], [place], [place], [place] or [place]; or in the [place] to [place], [place], [place], or [place]; and have fever, cough, and shortness of breath within the last 2 weeks of travel OR . Been in close contact with a person  diagnosed with COVID-19 within the last 2 weeks and have fever, cough, and shortness of breath . IF YOU DO NOT MEET THESE CRITERIA, YOU ARE CONSIDERED LOW RISK FOR COVID-19.  What to do if you are HIGH RISK for COVID-19?  . If you are having a medical emergency, call 911. . Seek medical care right away. Before you go to a doctor's office, urgent care or emergency department, call ahead and tell them about your recent travel, contact with someone diagnosed with COVID-19, and your symptoms. You should receive instructions from your physician's office regarding next steps of care.  . When you arrive at healthcare provider, tell the healthcare staff immediately you have returned from visiting [place], [place], [place], [place] or [place]; or traveled in the [place] to [place], [place], [place], or [place]; in the last two weeks or you have been in close contact with a person diagnosed with COVID-19 in the last 2 weeks.   . Tell the health care staff about your symptoms: fever, cough and shortness of breath. . After you have been seen by a medical provider, you will be either: o Tested for (COVID-19) and discharged home on quarantine except to seek medical care if symptoms worsen, and asked to  - Stay home and avoid contact with others until you get your results (4-5 days)  - Avoid travel on public transportation if possible (such as bus, train, or airplane) or o Sent to the Emergency Department by EMS for evaluation, COVID-19 testing, and possible admission depending on your condition and test results.  What to do if you are LOW RISK for COVID-19?  Reduce your risk of any infection by using the same precautions used for avoiding the common cold or flu:  . Wash your hands often with soap and warm water for at least 20 seconds.  If soap and water are not readily available, use an alcohol-based hand sanitizer with at least 60% alcohol.  . If coughing or sneezing, cover your mouth and nose by  coughing or sneezing into the elbow areas of your shirt or coat, into a tissue or into your sleeve (not your hands). . Avoid shaking hands with others and consider head nods or verbal greetings only. . Avoid touching your eyes, nose, or mouth with unwashed hands.  . Avoid close contact with people who are sick. . Avoid places or events with large numbers of people in one location, like concerts or sporting events. . Carefully consider travel plans you have or are making. . If you are planning any travel outside or inside the [place], visit the CDC's Travelers' Health webpage for the latest health notices. . If you have some symptoms but not all symptoms, continue to monitor at home and seek medical attention if your symptoms worsen. . If you are having a medical emergency, call 911.   ADDITIONAL HEALTHCARE OPTIONS FOR PATIENTS  [facility] / e-Visit: [email or web address]         [facility]: [phone]  [facility]: [phone]                   [facility]: [phone]   

## 2019-09-03 ENCOUNTER — Telehealth: Payer: Self-pay | Admitting: Physician Assistant

## 2019-09-03 NOTE — Telephone Encounter (Signed)
Rescheduled per 3/5 sch msg, per pt req. Called and spoke with pt, confirmed 3/10 appt

## 2019-09-06 ENCOUNTER — Inpatient Hospital Stay: Payer: BC Managed Care – PPO | Admitting: Internal Medicine

## 2019-09-06 ENCOUNTER — Inpatient Hospital Stay: Payer: BC Managed Care – PPO

## 2019-09-07 NOTE — Progress Notes (Signed)
[facility] OFFICE PROGRESS NOTE  [name], NP-C [place] [address]  DIAGNOSIS: Iron deficiency anemia secondary to menorrhagia.  PRIOR THERAPY: None  CURRENT THERAPY:  1) Integra +1 capsule p.o. daily. 2) Fereheme as needed. Last dose 12/31  INTERVAL HISTORY: Tina [name] 39 y.o. female returns to the clinic today for a follow-up visit.  The patient is feeling well today without any concerning complaints.  She denies any fatigue, lightheadedness, weakness, palpitations, chest pain, or decreased exercise tolerance.  The patient denies any bleeding or bruising including epistaxis, gingival bleeding,  hematuria, hematochezia, hemoptysis, melena, or hematochezia.  She still is reporting heavy menstrual periods in which she states she needs to change her tampon and her pad every few hours.  Her cycles are irregular and she often skips a monthly cycle periodically.  She has not been taking her Integra regularly since receiving her iron infusion due to the Integra pill being large and feeling like it is getting stuck in her throat.  She had a reaction to Nulecit infusion in which she developed lightheadedness, nausea, skin tightening, extremity swelling, abdominal pain, and back pain.  Her treatment was then changed to [facility] and she received 2 doses and tolerated well without any adverse side effects in December 2020.  The patient is here today for evaluation and a repeat CBC, ferritin, and iron studies.    MEDICAL HISTORY: Past Medical History:  Diagnosis Date  . Abnormal Pap smear    f/u was ok  . Anemia   . Infection    UTI  . Ovarian cyst   . Vaginal Pap smear, abnormal     ALLERGIES:  is allergic to diflucan [fluconazole]; ferrous sulfate; monistat [miconazole]; and tdap [tetanus-diphth-acell pertussis].  MEDICATIONS:  Current Outpatient Medications  Medication Sig Dispense Refill  . FeFum-FePoly-FA-B Cmp-C-Biot (INTEGRA  PLUS) CAPS Take 1 capsule by mouth every morning. 30 capsule 2   No current facility-administered medications for this visit.    SURGICAL HISTORY:  Past Surgical History:  Procedure Laterality Date  . NO PAST SURGERIES    . TUBAL LIGATION Bilateral 09/02/2013   Procedure: POST PARTUM TUBAL LIGATION;  Surgeon: Guss Bunde, MD;  Location: [facility];  Service: Gynecology;  Laterality: Bilateral;    REVIEW OF SYSTEMS:   Review of Systems  Constitutional: Negative for appetite change, chills, fatigue, fever and unexpected weight change.  HENT: Negative for mouth sores, nosebleeds, sore throat and trouble swallowing.   Eyes: Negative for eye problems and icterus.  Respiratory: Negative for cough, hemoptysis, shortness of breath and wheezing.   Cardiovascular: Negative for chest pain and leg swelling.  Gastrointestinal: Negative for abdominal pain, constipation, diarrhea, nausea and vomiting.  Genitourinary: Negative for bladder incontinence, difficulty urinating, dysuria, frequency and hematuria.   Musculoskeletal: Negative for back pain, gait problem, neck pain and neck stiffness.  Skin: Negative for itching and rash.  Neurological: Negative for dizziness, extremity weakness, gait problem, headaches, light-headedness and seizures.  Hematological: Positive for menorrhagia. negative for adenopathy. Does not bruise/bleed easily.  Psychiatric/Behavioral: Negative for confusion, depression and sleep disturbance. The patient is not nervous/anxious.     PHYSICAL EXAMINATION:  Blood pressure 134/75, pulse 72, temperature 98.3 F (36.8 C), temperature source Oral, resp. rate 18, height 5' 1.5" (1.562 m), weight 158 lb 14.4 oz (72.1 kg), SpO2 100 %.  ECOG PERFORMANCE STATUS: 0 - Asymptomatic  Physical Exam  Constitutional: Oriented to person, place, and time and well-developed, well-nourished, and in  no distress.  HENT:  Head: Normocephalic and atraumatic.  Mouth/Throat: Oropharynx is clear  and moist. No oropharyngeal exudate.  Eyes: Conjunctivae are normal. Right eye exhibits no discharge. Left eye exhibits no discharge. No scleral icterus.  Neck: Normal range of motion. Neck supple.  Cardiovascular: Normal rate, regular rhythm, normal heart sounds and intact distal pulses.   Pulmonary/Chest: Effort normal and breath sounds normal. No respiratory distress. No wheezes. No rales.  Abdominal: Soft. Bowel sounds are normal. Exhibits no distension and no mass. There is no tenderness.  Musculoskeletal: Normal range of motion. Exhibits no edema.  Lymphadenopathy:    No cervical adenopathy.  Neurological: Alert and oriented to person, place, and time. Exhibits normal muscle tone. Gait normal. Coordination normal.  Skin: Skin is warm and dry. No rash noted. Not diaphoretic. No erythema. No pallor.  Psychiatric: Mood, memory and judgment normal.  Vitals reviewed.  LABORATORY DATA: Lab Results  Component Value Date   WBC 5.0 09/08/2019   HGB 12.1 09/08/2019   HCT 38.4 09/08/2019   MCV 89.9 09/08/2019   PLT 237 09/08/2019      Chemistry      Component Value Date/Time   NA 137 08/29/2018 1038   NA 137 08/03/2018 1605   K 3.4 (L) 08/29/2018 1038   CL 107 08/29/2018 1038   CO2 23 08/29/2018 1038   BUN 9 08/29/2018 1038   BUN 9 08/03/2018 1605   CREATININE 0.67 08/29/2018 1038      Component Value Date/Time   CALCIUM 8.7 (L) 08/29/2018 1038   ALKPHOS 48 08/29/2018 1038   AST 17 08/29/2018 1038   ALT 13 08/29/2018 1038   BILITOT 0.4 08/29/2018 1038   BILITOT <0.2 08/03/2018 1605       RADIOGRAPHIC STUDIES:  No results found.   ASSESSMENT/PLAN:  This is a very pleasant 39 year old African-American female diagnosed with iron deficiency anemia secondary to menorrhagia.  The patient was seen with Dr. Julien Nordmann today.  The patient had a repeat CBC, iron studies, and ferritin performed today.  Her hemoglobin is within normal limits at 12.1.  Her iron studies and ferritin  are still pending at this time.  We will see the patient back for follow-up visit in 3 months for repeat CBC, iron studies, and ferritin.  If her iron studies and ferritin are still low, we may consider the patient for IV iron infusions.   He was encouraged to increase her intake of iron rich foods.  She was also encouraged to take her Integra plus with a full glass of water to help facilitate swallowing.  The patient was advised to call immediately if she has any concerning symptoms in the interval. The patient voices understanding of current disease status and treatment options and is in agreement with the current care plan. All questions were answered. The patient knows to call the clinic with any problems, questions or concerns. We can certainly see the patient much sooner if necessary  Orders Placed This Encounter  Procedures  . CBC with Differential (Cancer Center Only)    Standing Status:   Future    Standing Expiration Date:   09/07/2020  . Ferritin    Standing Status:   Future    Standing Expiration Date:   09/07/2020  . Iron and TIBC    Standing Status:   Future    Standing Expiration Date:   09/07/2020     Tobe Sos Kaitrin Seybold, PA-C 09/08/19  ADDENDUM: Hematology/Oncology Attending: I had a face-to-face encounter with  the patient today.  I recommended her care plan.  This is a very pleasant 39 years old African-American female with history of iron deficiency anemia secondary to menorrhagia.  The patient was treated recently with iron infusion initially with Ferrlecit but she developed hypersensitivity reaction to this product and the patient switch it to [facility] for 2 more doses and tolerated it much better. She is here today for evaluation and repeat blood work.  The patient is feeling much better with less fatigue and weakness. CBC today showed normal hemoglobin and hematocrit.  Iron study and ferritin are still pending. I recommended for the patient to continue on  the oral iron tablets for now. We will see her back for follow-up visit in 3 months for evaluation with repeat CBC, iron study and ferritin. The patient was advised to call immediately if she has any concerning symptoms in the interval.  Disclaimer: This note was dictated with voice recognition software. Similar sounding words can inadvertently be transcribed and may be missed upon review. Eilleen Kempf, MD 09/08/19

## 2019-09-08 ENCOUNTER — Encounter: Payer: Self-pay | Admitting: Physician Assistant

## 2019-09-08 ENCOUNTER — Inpatient Hospital Stay: Payer: BC Managed Care – PPO | Attending: Internal Medicine

## 2019-09-08 ENCOUNTER — Inpatient Hospital Stay (HOSPITAL_BASED_OUTPATIENT_CLINIC_OR_DEPARTMENT_OTHER): Payer: BC Managed Care – PPO | Admitting: Physician Assistant

## 2019-09-08 ENCOUNTER — Other Ambulatory Visit: Payer: Self-pay

## 2019-09-08 VITALS — BP 134/75 | HR 72 | Temp 98.3°F | Resp 18 | Ht 61.5 in | Wt 158.9 lb

## 2019-09-08 DIAGNOSIS — D500 Iron deficiency anemia secondary to blood loss (chronic): Secondary | ICD-10-CM | POA: Insufficient documentation

## 2019-09-08 DIAGNOSIS — D508 Other iron deficiency anemias: Secondary | ICD-10-CM | POA: Diagnosis not present

## 2019-09-08 DIAGNOSIS — D509 Iron deficiency anemia, unspecified: Secondary | ICD-10-CM

## 2019-09-08 DIAGNOSIS — N920 Excessive and frequent menstruation with regular cycle: Secondary | ICD-10-CM | POA: Insufficient documentation

## 2019-09-08 LAB — CBC WITH DIFFERENTIAL (CANCER CENTER ONLY)
Abs Immature Granulocytes: 0.01 10*3/uL (ref 0.00–0.07)
Basophils Absolute: 0 10*3/uL (ref 0.0–0.1)
Basophils Relative: 0 %
Eosinophils Absolute: 0 10*3/uL (ref 0.0–0.5)
Eosinophils Relative: 0 %
HCT: 38.4 % (ref 36.0–46.0)
Hemoglobin: 12.1 g/dL (ref 12.0–15.0)
Immature Granulocytes: 0 %
Lymphocytes Relative: 43 %
Lymphs Abs: 2.2 10*3/uL (ref 0.7–4.0)
MCH: 28.3 pg (ref 26.0–34.0)
MCHC: 31.5 g/dL (ref 30.0–36.0)
MCV: 89.9 fL (ref 80.0–100.0)
Monocytes Absolute: 0.4 10*3/uL (ref 0.1–1.0)
Monocytes Relative: 8 %
Neutro Abs: 2.4 10*3/uL (ref 1.7–7.7)
Neutrophils Relative %: 49 %
Platelet Count: 237 10*3/uL (ref 150–400)
RBC: 4.27 MIL/uL (ref 3.87–5.11)
RDW: 16.7 % — ABNORMAL HIGH (ref 11.5–15.5)
WBC Count: 5 10*3/uL (ref 4.0–10.5)
nRBC: 0 % (ref 0.0–0.2)

## 2019-09-09 ENCOUNTER — Telehealth: Payer: Self-pay | Admitting: Internal Medicine

## 2019-09-09 LAB — FERRITIN: Ferritin: 114 ng/mL (ref 11–307)

## 2019-09-09 LAB — IRON AND TIBC
Iron: 95 ug/dL (ref 41–142)
Saturation Ratios: 32 % (ref 21–57)
TIBC: 303 ug/dL (ref 236–444)
UIBC: 207 ug/dL (ref 120–384)

## 2019-09-09 NOTE — Telephone Encounter (Signed)
Scheduled per los. Called and left msg. Mailed printout  °

## 2019-09-14 ENCOUNTER — Other Ambulatory Visit: Payer: Self-pay

## 2019-09-15 ENCOUNTER — Ambulatory Visit (INDEPENDENT_AMBULATORY_CARE_PROVIDER_SITE_OTHER): Payer: BC Managed Care – PPO | Admitting: Women's Health

## 2019-09-15 ENCOUNTER — Encounter: Payer: Self-pay | Admitting: Women's Health

## 2019-09-15 ENCOUNTER — Encounter: Payer: BC Managed Care – PPO | Admitting: Women's Health

## 2019-09-15 VITALS — BP 110/80 | Ht 61.0 in | Wt 159.0 lb

## 2019-09-15 DIAGNOSIS — Z01419 Encounter for gynecological examination (general) (routine) without abnormal findings: Secondary | ICD-10-CM

## 2019-09-15 MED ORDER — TRANEXAMIC ACID 650 MG PO TABS: 1300.0000 mg | ORAL_TABLET | Freq: Three times a day (TID) | ORAL | 12 refills | Status: AC

## 2019-09-15 NOTE — Patient Instructions (Addendum)
Vit D 1000 iu daily Health Maintenance, Female Adopting a healthy lifestyle and getting preventive care are important in promoting health and wellness. Ask your health care provider about:  The right schedule for you to have regular tests and exams.  Things you can do on your own to prevent diseases and keep yourself healthy. What should I know about diet, weight, and exercise? Eat a healthy diet   Eat a diet that includes plenty of vegetables, fruits, low-fat dairy products, and lean protein.  Do not eat a lot of foods that are high in solid fats, added sugars, or sodium. Maintain a healthy weight Body mass index (BMI) is used to identify weight problems. It estimates body fat based on height and weight. Your health care provider can help determine your BMI and help you achieve or maintain a healthy weight. Get regular exercise Get regular exercise. This is one of the most important things you can do for your health. Most adults should:  Exercise for at least 150 minutes each week. The exercise should increase your heart rate and make you sweat (moderate-intensity exercise).  Do strengthening exercises at least twice a week. This is in addition to the moderate-intensity exercise.  Spend less time sitting. Even light physical activity can be beneficial. Watch cholesterol and blood lipids Have your blood tested for lipids and cholesterol at 40 years of age, then have this test every 5 years. Have your cholesterol levels checked more often if:  Your lipid or cholesterol levels are high.  You are older than 40 years of age.  You are at high risk for heart disease. What should I know about cancer screening? Depending on your health history and family history, you may need to have cancer screening at various ages. This may include screening for:  Breast cancer.  Cervical cancer.  Colorectal cancer.  Skin cancer.  Lung cancer. What should I know about heart disease, diabetes,  and high blood pressure? Blood pressure and heart disease  High blood pressure causes heart disease and increases the risk of stroke. This is more likely to develop in people who have high blood pressure readings, are of African descent, or are overweight.  Have your blood pressure checked: ? Every 3-5 years if you are 25-2 years of age. ? Every year if you are 43 years old or older. Diabetes Have regular diabetes screenings. This checks your fasting blood sugar level. Have the screening done:  Once every three years after age 53 if you are at a normal weight and have a low risk for diabetes.  More often and at a younger age if you are overweight or have a high risk for diabetes. What should I know about preventing infection? Hepatitis B If you have a higher risk for hepatitis B, you should be screened for this virus. Talk with your health care provider to find out if you are at risk for hepatitis B infection. Hepatitis C Testing is recommended for:  Everyone born from [birth year] through 1966.  Anyone with known risk factors for hepatitis C. Sexually transmitted infections (STIs)  Get screened for STIs, including gonorrhea and chlamydia, if: ? You are sexually active and are younger than 40 years of age. ? You are older than 40 years of age and your health care provider tells you that you are at risk for this type of infection. ? Your sexual activity has changed since you were last screened, and you are at increased risk for chlamydia or gonorrhea. Ask  your health care provider if you are at risk.  Ask your health care provider about whether you are at high risk for HIV. Your health care provider may recommend a prescription medicine to help prevent HIV infection. If you choose to take medicine to prevent HIV, you should first get tested for HIV. You should then be tested every 3 months for as long as you are taking the medicine. Pregnancy  If you are about to stop having your period  (premenopausal) and you may become pregnant, seek counseling before you get pregnant.  Take 400 to 800 micrograms (mcg) of folic acid every day if you become pregnant.  Ask for birth control (contraception) if you want to prevent pregnancy. Osteoporosis and menopause Osteoporosis is a disease in which the bones lose minerals and strength with aging. This can result in bone fractures. If you are 20 years old or older, or if you are at risk for osteoporosis and fractures, ask your health care provider if you should:  Be screened for bone loss.  Take a calcium or vitamin D supplement to lower your risk of fractures.  Be given hormone replacement therapy (HRT) to treat symptoms of menopause. Follow these instructions at home: Lifestyle  Do not use any products that contain nicotine or tobacco, such as cigarettes, e-cigarettes, and chewing tobacco. If you need help quitting, ask your health care provider.  Do not use street drugs.  Do not share needles.  Ask your health care provider for help if you need support or information about quitting drugs. Alcohol use  Do not drink alcohol if: ? Your health care provider tells you not to drink. ? You are pregnant, may be pregnant, or are planning to become pregnant.  If you drink alcohol: ? Limit how much you use to 0-1 drink a day. ? Limit intake if you are breastfeeding.  Be aware of how much alcohol is in your drink. In the [place] one drink equals one 12 oz bottle of beer (355 mL), one 5 oz glass of wine (148 mL), or one 1 oz glass of hard liquor (44 mL). General instructions  Schedule regular health, dental, and eye exams.  Stay current with your vaccines.  Tell your health care provider if: ? You often feel depressed. ? You have ever been abused or do not feel safe at home. Summary  Adopting a healthy lifestyle and getting preventive care are important in promoting health and wellness.  Follow your health care provider's  instructions about healthy diet, exercising, and getting tested or screened for diseases.  Follow your health care provider's instructions on monitoring your cholesterol and blood pressure. This information is not intended to replace advice given to you by your health care provider. Make sure you discuss any questions you have with your health care provider. Document Revised: 06/10/2018 Document Reviewed: 06/10/2018 Elsevier Patient Education  2020 [place].  Endometrial Ablation Endometrial ablation is a procedure that destroys the thin inner layer of the lining of the uterus (endometrium). This procedure may be done:  To stop heavy periods.  To stop bleeding that is causing anemia.  To control irregular bleeding.  To treat bleeding caused by small tumors (fibroids) in the endometrium. This procedure is often an alternative to major surgery, such as removal of the uterus and cervix (hysterectomy). As a result of this procedure:  You may not be able to have children. However, if you are premenopausal (you have not gone through menopause): ? You may still  have a small chance of getting pregnant. ? You will need to use a reliable method of birth control after the procedure to prevent pregnancy.  You may stop having a menstrual period, or you may have only a small amount of bleeding during your period. Menstruation may return several years after the procedure. Tell a health care provider about:  Any allergies you have.  All medicines you are taking, including vitamins, herbs, eye drops, creams, and over-the-counter medicines.  Any problems you or family members have had with the use of anesthetic medicines.  Any blood disorders you have.  Any surgeries you have had.  Any medical conditions you have. What are the risks? Generally, this is a safe procedure. However, problems may occur, including:  A hole (perforation) in the uterus or bowel.  Infection of the uterus,  bladder, or vagina.  Bleeding.  Damage to other structures or organs.  An air bubble in the lung (air embolus).  Problems with pregnancy after the procedure.  Failure of the procedure.  Decreased ability to diagnose cancer in the endometrium. What happens before the procedure?  You will have tests of your endometrium to make sure there are no pre-cancerous cells or cancer cells present.  You may have an ultrasound of the uterus.  You may be given medicines to thin the endometrium.  Ask your health care provider about: ? Changing or stopping your regular medicines. This is especially important if you take diabetes medicines or blood thinners. ? Taking medicines such as aspirin and ibuprofen. These medicines can thin your blood. Do not take these medicines before your procedure if your doctor tells you not to.  Plan to have someone take you home from the hospital or clinic. What happens during the procedure?   You will lie on an exam table with your feet and legs supported as in a pelvic exam.  To lower your risk of infection: ? Your health care team will wash or sanitize their hands and put on germ-free (sterile) gloves. ? Your genital area will be washed with soap.  An IV tube will be inserted into one of your veins.  You will be given a medicine to help you relax (sedative).  A surgical instrument with a light and camera (resectoscope) will be inserted into your vagina and moved into your uterus. This allows your surgeon to see inside your uterus.  Endometrial tissue will be removed using one of the following methods: ? Radiofrequency. This method uses a radiofrequency-alternating electric current to remove the endometrium. ? Cryotherapy. This method uses extreme cold to freeze the endometrium. ? Heated-free liquid. This method uses a heated saltwater (saline) solution to remove the endometrium. ? Microwave. This method uses high-energy microwaves to heat up the  endometrium and remove it. ? Thermal balloon. This method involves inserting a catheter with a balloon tip into the uterus. The balloon tip is filled with heated fluid to remove the endometrium. The procedure may vary among health care providers and hospitals. What happens after the procedure?  Your blood pressure, heart rate, breathing rate, and blood oxygen level will be monitored until the medicines you were given have worn off.  As tissue healing occurs, you may notice vaginal bleeding for 4-6 weeks after the procedure. You may also experience: ? Cramps. ? Thin, watery vaginal discharge that is light pink or brown in color. ? A need to urinate more frequently than usual. ? Nausea.  Do not drive for 24 hours if you were given  a sedative.  Do not have sex or insert anything into your vagina until your health care provider approves. Summary  Endometrial ablation is done to treat the many causes of heavy menstrual bleeding.  The procedure may be done only after medications have been tried to control the bleeding.  Plan to have someone take you home from the hospital or clinic. This information is not intended to replace advice given to you by your health care provider. Make sure you discuss any questions you have with your health care provider. Document Revised: 12/02/2017 Document Reviewed: 07/04/2016 Elsevier Patient Education  [place].

## 2019-09-15 NOTE — Progress Notes (Signed)
Tina [name] [birth year] [id number]    History:    Presents for annual exam.  Monthly 7-day cycle heavy flow for 3 days.  Has had to have iron infusion history of anemia, hematologist manages labs.  08/2018 - colonoscopy.  BTL.  History of cryo many years ago with normal Paps after.  Past medical history, past surgical history, family history and social history were all reviewed and documented in the EPIC chart.  Works [employer].  4 children ages 63, 52, 19 and 69 and 2 stepchildren all doing well.  Mother and sister also have anemia, father hypertension and hypercholesteremia.  ROS:  A ROS was performed and pertinent positives and negatives are included.  Exam:  Vitals:   09/15/19 0845  BP: 110/80  Weight: 159 lb (72.1 kg)  Height: 5\' 1"  (1.549 m)   Body mass index is 30.04 kg/m.   General appearance:  Normal Thyroid:  Symmetrical, normal in size, without palpable masses or nodularity. Respiratory  Auscultation:  Clear without wheezing or rhonchi Cardiovascular  Auscultation:  Regular rate, without rubs, murmurs or gallops  Edema/varicosities:  Not grossly evident Abdominal  Soft,nontender, without masses, guarding or rebound.  Liver/spleen:  No organomegaly noted  Hernia:  None appreciated  Skin  Inspection:  Grossly normal   Breasts: Examined lying and sitting.     Right: Without masses, retractions, discharge or axillary adenopathy.     Left: Without masses, retractions, discharge or axillary adenopathy. Gentitourinary   Inguinal/mons:  Normal without inguinal adenopathy  External genitalia:  Normal  BUS/Urethra/Skene's glands:  Normal  Vagina:  Normal  Cervix:  Normal  Uterus:  normal in size, shape and contour.  Midline and mobile  Adnexa/parametria:     Rt: Without masses or tenderness.   Lt: Without masses or tenderness.  Anus and perineum: Normal  Digital rectal exam: Normal sphincter tone without palpated masses or  tenderness  Assessment/Plan:  39 y.o. MBF G4 P4 for annual exam with no GYN complaints.  Monthly 7-day cycle with menorrhagia Anemia-hematologist manages iron infusions/labs Cryo-many years ago normal Paps after  Plan: Options for menorrhagia reviewed we will try Lysteda 650 mg 2 tablets 3 times a day first 5 days of cycle call if no relief of heavy flow.  Options of endometrial ablation discussed reviewed sonohysterogram prior will schedule with Dr Delilah Shan if decides to proceed.  Aware of importance of iron rich foods in diet and continuing iron supplement.  SBEs, annual screening mammogram at 40, calcium rich foods, vitamin D 1000 IUs daily.  Aware of need to exercise lower calories/carbs encouraged.  Pap normal 08/2017, HPV typing not checked, Pap with HR HPV typing.  New screening guidelines reviewed.    [place], 12:57 PM 09/15/2019

## 2019-09-17 LAB — PAP, TP IMAGING W/ HPV RNA, RFLX HPV TYPE 16,18/45: HPV DNA High Risk: NOT DETECTED

## 2019-12-07 ENCOUNTER — Other Ambulatory Visit: Payer: BC Managed Care – PPO

## 2019-12-07 ENCOUNTER — Ambulatory Visit: Payer: BC Managed Care – PPO | Admitting: Internal Medicine

## 2019-12-09 ENCOUNTER — Inpatient Hospital Stay: Payer: BC Managed Care – PPO

## 2019-12-09 ENCOUNTER — Inpatient Hospital Stay: Payer: BC Managed Care – PPO | Admitting: Internal Medicine

## 2020-01-18 ENCOUNTER — Encounter: Payer: Self-pay | Admitting: Internal Medicine

## 2020-01-18 ENCOUNTER — Inpatient Hospital Stay (HOSPITAL_BASED_OUTPATIENT_CLINIC_OR_DEPARTMENT_OTHER): Payer: BC Managed Care – PPO | Admitting: Internal Medicine

## 2020-01-18 ENCOUNTER — Telehealth: Payer: Self-pay | Admitting: Internal Medicine

## 2020-01-18 ENCOUNTER — Inpatient Hospital Stay: Payer: BC Managed Care – PPO | Attending: Physician Assistant

## 2020-01-18 ENCOUNTER — Other Ambulatory Visit: Payer: Self-pay

## 2020-01-18 VITALS — BP 123/70 | HR 72 | Temp 97.7°F | Resp 18 | Ht 61.0 in | Wt 151.5 lb

## 2020-01-18 DIAGNOSIS — N390 Urinary tract infection, site not specified: Secondary | ICD-10-CM | POA: Diagnosis not present

## 2020-01-18 DIAGNOSIS — N920 Excessive and frequent menstruation with regular cycle: Secondary | ICD-10-CM | POA: Diagnosis not present

## 2020-01-18 DIAGNOSIS — D500 Iron deficiency anemia secondary to blood loss (chronic): Secondary | ICD-10-CM | POA: Diagnosis present

## 2020-01-18 DIAGNOSIS — Z79899 Other long term (current) drug therapy: Secondary | ICD-10-CM | POA: Diagnosis not present

## 2020-01-18 DIAGNOSIS — D508 Other iron deficiency anemias: Secondary | ICD-10-CM

## 2020-01-18 LAB — FERRITIN: Ferritin: 51 ng/mL (ref 11–307)

## 2020-01-18 LAB — CBC WITH DIFFERENTIAL (CANCER CENTER ONLY)
Abs Immature Granulocytes: 0.01 10*3/uL (ref 0.00–0.07)
Basophils Absolute: 0 10*3/uL (ref 0.0–0.1)
Basophils Relative: 1 %
Eosinophils Absolute: 0 10*3/uL (ref 0.0–0.5)
Eosinophils Relative: 1 %
HCT: 34.6 % — ABNORMAL LOW (ref 36.0–46.0)
Hemoglobin: 11.1 g/dL — ABNORMAL LOW (ref 12.0–15.0)
Immature Granulocytes: 0 %
Lymphocytes Relative: 43 %
Lymphs Abs: 1.7 10*3/uL (ref 0.7–4.0)
MCH: 28.5 pg (ref 26.0–34.0)
MCHC: 32.1 g/dL (ref 30.0–36.0)
MCV: 88.7 fL (ref 80.0–100.0)
Monocytes Absolute: 0.3 10*3/uL (ref 0.1–1.0)
Monocytes Relative: 9 %
Neutro Abs: 1.8 10*3/uL (ref 1.7–7.7)
Neutrophils Relative %: 46 %
Platelet Count: 237 10*3/uL (ref 150–400)
RBC: 3.9 MIL/uL (ref 3.87–5.11)
RDW: 13.2 % (ref 11.5–15.5)
WBC Count: 3.8 10*3/uL — ABNORMAL LOW (ref 4.0–10.5)
nRBC: 0 % (ref 0.0–0.2)

## 2020-01-18 LAB — IRON AND TIBC
Iron: 55 ug/dL (ref 41–142)
Saturation Ratios: 20 % — ABNORMAL LOW (ref 21–57)
TIBC: 270 ug/dL (ref 236–444)
UIBC: 216 ug/dL (ref 120–384)

## 2020-01-18 NOTE — Progress Notes (Signed)
[facility] [phone]   [phone]  OFFICE PROGRESS NOTE  [name], NP-C [place] [address]  DIAGNOSIS: Iron deficiency anemia secondary to menorrhagia.  PRIOR THERAPY: None  CURRENT THERAPY: Integra +1 capsule p.o. daily.  She took it only for 2 weeks and stopped.  INTERVAL HISTORY: Tina [name] 39 y.o. female returns to the clinic today for follow-up visit.  The patient is feeling fine today with no concerning complaints.  She denied having any significant fatigue or weakness.  She denied having any dizzy spells.  She has no nausea, vomiting, diarrhea or constipation.  She has no chest pain, shortness of breath, cough or hemoptysis.  She was started on treatment with Integra +1 capsule p.o. daily but she took it only for around 2 weeks and then discontinued because she does not want to take any medications.  She has been doing intermittent fasting.  She does not eat a lot of red meat.  She is here today for evaluation and repeat blood work.  MEDICAL HISTORY: Past Medical History:  Diagnosis Date  . Abnormal Pap smear    f/u was ok  . Anemia   . Infection    UTI  . Ovarian cyst   . Vaginal Pap smear, abnormal     ALLERGIES:  is allergic to diflucan [fluconazole], ferrous sulfate, other, monistat [miconazole], and tdap [tetanus-diphth-acell pertussis].  MEDICATIONS:  Current Outpatient Medications  Medication Sig Dispense Refill  . FeFum-FePoly-FA-B Cmp-C-Biot (INTEGRA PLUS) CAPS Take 1 capsule by mouth every morning. 30 capsule 2  . tranexamic acid (LYSTEDA) 650 MG TABS tablet Take 2 tablets (1,300 mg total) by mouth 3 (three) times daily. 30 tablet 12   No current facility-administered medications for this visit.    SURGICAL HISTORY:  Past Surgical History:  Procedure Laterality Date  . NO PAST SURGERIES    . TUBAL LIGATION Bilateral 09/02/2013   Procedure: POST PARTUM TUBAL LIGATION;   Surgeon: Guss Bunde, MD;  Location: [facility];  Service: Gynecology;  Laterality: Bilateral;    REVIEW OF SYSTEMS:  A comprehensive review of systems was negative.   PHYSICAL EXAMINATION: General appearance: alert, cooperative and no distress Head: Normocephalic, without obvious abnormality, atraumatic Neck: no adenopathy, no JVD, supple, symmetrical, trachea midline and thyroid not enlarged, symmetric, no tenderness/mass/nodules Lymph nodes: Cervical, supraclavicular, and axillary nodes normal. Resp: clear to auscultation bilaterally Back: symmetric, no curvature. ROM normal. No CVA tenderness. Cardio: regular rate and rhythm, S1, S2 normal, no murmur, click, rub or gallop GI: soft, non-tender; bowel sounds normal; no masses,  no organomegaly Extremities: extremities normal, atraumatic, no cyanosis or edema  ECOG PERFORMANCE STATUS: 1 - Symptomatic but completely ambulatory  Blood pressure 123/70, pulse 72, temperature 97.7 F (36.5 C), temperature source Temporal, resp. rate 18, height 5\' 1"  (1.549 m), weight 151 lb 8 oz (68.7 kg), SpO2 100 %.  LABORATORY DATA: Lab Results  Component Value Date   WBC 3.8 (L) 01/18/2020   HGB 11.1 (L) 01/18/2020   HCT 34.6 (L) 01/18/2020   MCV 88.7 01/18/2020   PLT 237 01/18/2020      Chemistry      Component Value Date/Time   NA 137 08/29/2018 1038   NA 137 08/03/2018 1605   K 3.4 (L) 08/29/2018 1038   CL 107 08/29/2018 1038   CO2 23 08/29/2018 1038   BUN 9 08/29/2018 1038   BUN 9 08/03/2018 1605   CREATININE 0.67  08/29/2018 1038      Component Value Date/Time   CALCIUM 8.7 (L) 08/29/2018 1038   ALKPHOS 48 08/29/2018 1038   AST 17 08/29/2018 1038   ALT 13 08/29/2018 1038   BILITOT 0.4 08/29/2018 1038   BILITOT <0.2 08/03/2018 1605       RADIOGRAPHIC STUDIES: No results found.  ASSESSMENT AND PLAN:  This is a very pleasant 40 years old African-American female with history of iron deficiency anemia secondary to menorrhagia.     The patient was started on treatment with Integra +1 capsule p.o. daily but she took it only for 2 weeks and then discontinued it on her own. CBC today showed mild anemia with hemoglobin of 11.1.  Iron study and ferritin are still pending.  She is not interested in taking oral iron tablets. I strongly recommended for the patient to increase her iron rich diet over the next few months.  If the pending iron study showed severe iron deficiency, we may consider the patient for iron infusion. She was advised to call immediately if she has any concerning symptoms in the interval. The patient voices understanding of current disease status and treatment options and is in agreement with the current care plan. All questions were answered. The patient knows to call the clinic with any problems, questions or concerns. We can certainly see the patient much sooner if necessary.  Disclaimer: This note was dictated with voice recognition software. Similar sounding words can inadvertently be transcribed and may not be corrected upon review.

## 2020-01-18 NOTE — Telephone Encounter (Signed)
Scheduled appts per 7/20 LOS - pt is aware of appts. Per patient no print out needed.

## 2020-02-18 ENCOUNTER — Telehealth: Payer: Self-pay

## 2020-02-18 NOTE — Telephone Encounter (Signed)
Pt. Called wanting to get a letter of medical exemption form getting the covid vaccine due to her ongoing issues with anemia, she doesn't want to put something in her body that may mess up anything.

## 2020-02-19 NOTE — Telephone Encounter (Signed)
I recommend she get the Covid vaccine. Having anemia is not a contraindication to getting the vaccine.

## 2020-02-21 NOTE — Telephone Encounter (Signed)
Called pt. And let her know that she could not get a letter to get out of getting the vaccine for anemia and it is recommended that she gets the vaccine.

## 2020-02-28 ENCOUNTER — Other Ambulatory Visit: Payer: Self-pay

## 2020-04-17 ENCOUNTER — Inpatient Hospital Stay: Payer: BC Managed Care – PPO | Admitting: Internal Medicine

## 2020-04-17 ENCOUNTER — Telehealth: Payer: Self-pay | Admitting: Internal Medicine

## 2020-04-17 ENCOUNTER — Inpatient Hospital Stay: Payer: BC Managed Care – PPO

## 2020-04-17 NOTE — Telephone Encounter (Signed)
Called pt per 10/18 sch msg - no answer. Left message for patient to call back to reschedule appt.

## 2020-04-18 ENCOUNTER — Other Ambulatory Visit: Payer: BC Managed Care – PPO

## 2020-04-18 ENCOUNTER — Ambulatory Visit: Payer: BC Managed Care – PPO | Admitting: Internal Medicine

## 2020-04-25 ENCOUNTER — Inpatient Hospital Stay (HOSPITAL_BASED_OUTPATIENT_CLINIC_OR_DEPARTMENT_OTHER): Payer: BC Managed Care – PPO | Admitting: Internal Medicine

## 2020-04-25 ENCOUNTER — Other Ambulatory Visit: Payer: Self-pay

## 2020-04-25 ENCOUNTER — Inpatient Hospital Stay: Payer: BC Managed Care – PPO | Attending: Internal Medicine

## 2020-04-25 ENCOUNTER — Encounter: Payer: Self-pay | Admitting: Internal Medicine

## 2020-04-25 VITALS — BP 133/61 | HR 68 | Temp 97.3°F | Resp 20 | Ht 61.0 in | Wt 156.1 lb

## 2020-04-25 DIAGNOSIS — D500 Iron deficiency anemia secondary to blood loss (chronic): Secondary | ICD-10-CM

## 2020-04-25 DIAGNOSIS — D508 Other iron deficiency anemias: Secondary | ICD-10-CM

## 2020-04-25 DIAGNOSIS — N920 Excessive and frequent menstruation with regular cycle: Secondary | ICD-10-CM | POA: Insufficient documentation

## 2020-04-25 LAB — CBC WITH DIFFERENTIAL (CANCER CENTER ONLY)
Abs Immature Granulocytes: 0.01 10*3/uL (ref 0.00–0.07)
Basophils Absolute: 0 10*3/uL (ref 0.0–0.1)
Basophils Relative: 0 %
Eosinophils Absolute: 0 10*3/uL (ref 0.0–0.5)
Eosinophils Relative: 0 %
HCT: 35.3 % — ABNORMAL LOW (ref 36.0–46.0)
Hemoglobin: 11.4 g/dL — ABNORMAL LOW (ref 12.0–15.0)
Immature Granulocytes: 0 %
Lymphocytes Relative: 50 %
Lymphs Abs: 2.4 10*3/uL (ref 0.7–4.0)
MCH: 28.6 pg (ref 26.0–34.0)
MCHC: 32.3 g/dL (ref 30.0–36.0)
MCV: 88.5 fL (ref 80.0–100.0)
Monocytes Absolute: 0.4 10*3/uL (ref 0.1–1.0)
Monocytes Relative: 8 %
Neutro Abs: 2.1 10*3/uL (ref 1.7–7.7)
Neutrophils Relative %: 42 %
Platelet Count: 297 10*3/uL (ref 150–400)
RBC: 3.99 MIL/uL (ref 3.87–5.11)
RDW: 13.4 % (ref 11.5–15.5)
WBC Count: 5 10*3/uL (ref 4.0–10.5)
nRBC: 0 % (ref 0.0–0.2)

## 2020-04-25 LAB — FERRITIN: Ferritin: 15 ng/mL (ref 11–307)

## 2020-04-25 LAB — IRON AND TIBC
Iron: 42 ug/dL (ref 41–142)
Saturation Ratios: 11 % — ABNORMAL LOW (ref 21–57)
TIBC: 373 ug/dL (ref 236–444)
UIBC: 331 ug/dL (ref 120–384)

## 2020-04-25 NOTE — Progress Notes (Signed)
[place] [phone]   [phone]  OFFICE PROGRESS NOTE  [name], NP-C [place] [address]  DIAGNOSIS: Iron deficiency anemia secondary to menorrhagia.  PRIOR THERAPY: None  CURRENT THERAPY: Integra +1 capsule p.o. daily.  She took it only for 2 weeks and stopped.  INTERVAL HISTORY: Tina [name] 39 y.o. female returns to the clinic today for follow-up visit.  The patient is feeling fine today with no concerning complaints.  She is not taking the Integra plus.  She increased the iron rich diet.  She denied having any chest pain, shortness of breath, cough or hemoptysis.  She denied having any fever or chills.  She has no nausea, vomiting, diarrhea or constipation.  She has no headache or visual changes.  She is here today for evaluation and repeat CBC, iron study and ferritin.  MEDICAL HISTORY: Past Medical History:  Diagnosis Date  . Abnormal Pap smear    f/u was ok  . Anemia   . Infection    UTI  . Ovarian cyst   . Vaginal Pap smear, abnormal     ALLERGIES:  is allergic to diflucan [fluconazole], ferrous sulfate, other, monistat [miconazole], and tdap [tetanus-diphth-acell pertussis].  MEDICATIONS:  Current Outpatient Medications  Medication Sig Dispense Refill  . FeFum-FePoly-FA-B Cmp-C-Biot (INTEGRA PLUS) CAPS Take 1 capsule by mouth every morning. (Patient not taking: Reported on 04/25/2020) 30 capsule 2  . tranexamic acid (LYSTEDA) 650 MG TABS tablet Take 2 tablets (1,300 mg total) by mouth 3 (three) times daily. (Patient not taking: Reported on 04/25/2020) 30 tablet 12   No current facility-administered medications for this visit.    SURGICAL HISTORY:  Past Surgical History:  Procedure Laterality Date  . NO PAST SURGERIES    . TUBAL LIGATION Bilateral 09/02/2013   Procedure: POST PARTUM TUBAL LIGATION;  Surgeon: Guss Bunde, MD;  Location: [facility];  Service: Gynecology;   Laterality: Bilateral;    REVIEW OF SYSTEMS:  A comprehensive review of systems was negative.   PHYSICAL EXAMINATION: General appearance: alert, cooperative and no distress Head: Normocephalic, without obvious abnormality, atraumatic Neck: no adenopathy, no JVD, supple, symmetrical, trachea midline and thyroid not enlarged, symmetric, no tenderness/mass/nodules Lymph nodes: Cervical, supraclavicular, and axillary nodes normal. Resp: clear to auscultation bilaterally Back: symmetric, no curvature. ROM normal. No CVA tenderness. Cardio: regular rate and rhythm, S1, S2 normal, no murmur, click, rub or gallop GI: soft, non-tender; bowel sounds normal; no masses,  no organomegaly Extremities: extremities normal, atraumatic, no cyanosis or edema  ECOG PERFORMANCE STATUS: 1 - Symptomatic but completely ambulatory  Blood pressure 133/61, pulse 68, temperature (!) 97.3 F (36.3 C), temperature source Tympanic, resp. rate 20, height 5\' 1"  (1.549 m), weight 156 lb 1.6 oz (70.8 kg), SpO2 100 %.  LABORATORY DATA: Lab Results  Component Value Date   WBC 5.0 04/25/2020   HGB 11.4 (L) 04/25/2020   HCT 35.3 (L) 04/25/2020   MCV 88.5 04/25/2020   PLT 297 04/25/2020      Chemistry      Component Value Date/Time   NA 137 08/29/2018 1038   NA 137 08/03/2018 1605   K 3.4 (L) 08/29/2018 1038   CL 107 08/29/2018 1038   CO2 23 08/29/2018 1038   BUN 9 08/29/2018 1038   BUN 9 08/03/2018 1605   CREATININE 0.67 08/29/2018 1038      Component Value Date/Time   CALCIUM 8.7 (L) 08/29/2018 1038  ALKPHOS 48 08/29/2018 1038   AST 17 08/29/2018 1038   ALT 13 08/29/2018 1038   BILITOT 0.4 08/29/2018 1038   BILITOT <0.2 08/03/2018 1605       RADIOGRAPHIC STUDIES: No results found.  ASSESSMENT AND PLAN:  This is a very pleasant 39 years old African-American female with history of iron deficiency anemia secondary to menorrhagia.   The patient was started on treatment with Integra +1 capsule p.o.  daily but she took it only for 2 weeks and then discontinued it on her own. The patient is currently on observation and she is feeling fine. Repeat CBC today showed mild anemia with hemoglobin of 11.4.  Iron study and ferritin are still pending. I recommended for her to continue with the iron rich diet for now and if the iron study showed significant iron deficiency, I would consider her for intravenous iron infusion. The patient will come back for follow-up visit in 3 months for evaluation and repeat blood work. She was advised to call immediately if she has any concerning symptoms in the interval. The patient voices understanding of current disease status and treatment options and is in agreement with the current care plan. All questions were answered. The patient knows to call the clinic with any problems, questions or concerns. We can certainly see the patient much sooner if necessary.  Disclaimer: This note was dictated with voice recognition software. Similar sounding words can inadvertently be transcribed and may not be corrected upon review.

## 2020-04-26 ENCOUNTER — Telehealth: Payer: Self-pay | Admitting: Internal Medicine

## 2020-04-26 NOTE — Telephone Encounter (Signed)
Scheduled per los. Called and spoke with patient. Confirmed appt 

## 2020-07-17 ENCOUNTER — Inpatient Hospital Stay: Payer: Medicaid Other

## 2020-07-17 ENCOUNTER — Inpatient Hospital Stay: Payer: Medicaid Other | Admitting: Internal Medicine

## 2020-09-20 ENCOUNTER — Encounter: Payer: BC Managed Care – PPO | Admitting: Nurse Practitioner

## 2022-05-15 ENCOUNTER — Encounter: Payer: Self-pay | Admitting: Internal Medicine

## 2023-06-30 ENCOUNTER — Encounter: Payer: Self-pay | Admitting: Internal Medicine

## 2024-01-23 ENCOUNTER — Encounter: Payer: Self-pay | Admitting: Internal Medicine

## 2024-07-26 ENCOUNTER — Encounter

## 2024-09-22 ENCOUNTER — Encounter: Admitting: Gastroenterology
